# Patient Record
Sex: Male | Born: 1965 | Race: Black or African American | Hispanic: No | Marital: Married | Smoking: Never smoker
Health system: Southern US, Community
[De-identification: ages and names within clinical notes are randomized; demographics above are authoritative.]

## PROBLEM LIST (undated history)

## (undated) DIAGNOSIS — M549 Dorsalgia, unspecified: Secondary | ICD-10-CM

## (undated) DIAGNOSIS — I10 Essential (primary) hypertension: Secondary | ICD-10-CM

## (undated) DIAGNOSIS — K219 Gastro-esophageal reflux disease without esophagitis: Secondary | ICD-10-CM

## (undated) DIAGNOSIS — R42 Dizziness and giddiness: Secondary | ICD-10-CM

## (undated) DIAGNOSIS — W3400XA Accidental discharge from unspecified firearms or gun, initial encounter: Secondary | ICD-10-CM

## (undated) DIAGNOSIS — R0602 Shortness of breath: Secondary | ICD-10-CM

## (undated) DIAGNOSIS — S21339A Puncture wound without foreign body of unspecified front wall of thorax with penetration into thoracic cavity, initial encounter: Secondary | ICD-10-CM

## (undated) DIAGNOSIS — G4730 Sleep apnea, unspecified: Secondary | ICD-10-CM

## (undated) DIAGNOSIS — G8929 Other chronic pain: Secondary | ICD-10-CM

## (undated) DIAGNOSIS — R600 Localized edema: Secondary | ICD-10-CM

## (undated) HISTORY — PX: COLON SURGERY: SHX602

## (undated) HISTORY — PX: CHOLECYSTECTOMY: SHX55

## (undated) HISTORY — DX: Gastro-esophageal reflux disease without esophagitis: K21.9

## (undated) HISTORY — PX: FINGER SURGERY: SHX640

## (undated) HISTORY — PX: OTHER SURGICAL HISTORY: SHX169

---

## 2011-04-27 DIAGNOSIS — R42 Dizziness and giddiness: Secondary | ICD-10-CM

## 2011-04-27 DIAGNOSIS — R0602 Shortness of breath: Principal | ICD-10-CM

## 2011-04-27 HISTORY — DX: Dizziness and giddiness: R42

## 2011-04-27 HISTORY — DX: Shortness of breath: R06.02

## 2011-09-18 ENCOUNTER — Emergency Department (HOSPITAL_COMMUNITY): Payer: Medicaid - Out of State

## 2011-09-18 ENCOUNTER — Encounter (HOSPITAL_COMMUNITY): Payer: Self-pay | Admitting: Emergency Medicine

## 2011-09-18 ENCOUNTER — Emergency Department (HOSPITAL_COMMUNITY)
Admission: EM | Admit: 2011-09-18 | Discharge: 2011-09-18 | Disposition: A | Discharge status: Home / Self Care | Payer: Medicaid - Out of State | Attending: Emergency Medicine | Admitting: Emergency Medicine

## 2011-09-18 DIAGNOSIS — Z794 Long term (current) use of insulin: Secondary | ICD-10-CM | POA: Insufficient documentation

## 2011-09-18 DIAGNOSIS — I10 Essential (primary) hypertension: Secondary | ICD-10-CM | POA: Insufficient documentation

## 2011-09-18 DIAGNOSIS — R609 Edema, unspecified: Secondary | ICD-10-CM | POA: Insufficient documentation

## 2011-09-18 DIAGNOSIS — R0602 Shortness of breath: Secondary | ICD-10-CM | POA: Insufficient documentation

## 2011-09-18 DIAGNOSIS — J40 Bronchitis, not specified as acute or chronic: Secondary | ICD-10-CM | POA: Insufficient documentation

## 2011-09-18 DIAGNOSIS — E119 Type 2 diabetes mellitus without complications: Secondary | ICD-10-CM | POA: Insufficient documentation

## 2011-09-18 HISTORY — DX: Dorsalgia, unspecified: M54.9

## 2011-09-18 HISTORY — DX: Essential (primary) hypertension: I10

## 2011-09-18 HISTORY — DX: Other chronic pain: G89.29

## 2011-09-18 LAB — CBC
HCT: 39.1 % (ref 39.0–52.0)
Hemoglobin: 13 g/dL (ref 13.0–17.0)
MCH: 27.8 pg (ref 26.0–34.0)
MCHC: 33.2 g/dL (ref 30.0–36.0)
MCV: 83.5 fL (ref 78.0–100.0)
Platelets: 300 10*3/uL (ref 150–400)
RBC: 4.68 MIL/uL (ref 4.22–5.81)
RDW: 15.3 % (ref 11.5–15.5)
WBC: 12.6 10*3/uL — ABNORMAL HIGH (ref 4.0–10.5)

## 2011-09-18 LAB — BASIC METABOLIC PANEL
BUN: 12 mg/dL (ref 6–23)
CO2: 30 mEq/L (ref 19–32)
Calcium: 9.3 mg/dL (ref 8.4–10.5)
Chloride: 104 mEq/L (ref 96–112)
Creatinine, Ser: 0.89 mg/dL (ref 0.50–1.35)
GFR calc Af Amer: >90 mL/min (ref >90)
GFR calc non Af Amer: >90 mL/min (ref >90)
Glucose, Bld: 134 mg/dL — ABNORMAL HIGH (ref 70–99)
Potassium: 3.9 mEq/L (ref 3.5–5.1)
Sodium: 142 mEq/L (ref 135–145)

## 2011-09-18 LAB — URINALYSIS, ROUTINE W REFLEX MICROSCOPIC
Bilirubin Urine: NEGATIVE
Glucose, UA: >1000 mg/dL — AB
Hgb urine dipstick: NEGATIVE
Ketones, ur: NEGATIVE mg/dL
Leukocytes, UA: NEGATIVE
Nitrite: NEGATIVE
Protein, ur: 30 mg/dL — AB
Specific Gravity, Urine: 1.024 (ref 1.005–1.030)
Urobilinogen, UA: 0.2 mg/dL (ref 0.0–1.0)
pH: 6 (ref 5.0–8.0)

## 2011-09-18 LAB — DIFFERENTIAL
Basophils Absolute: 0 10*3/uL (ref 0.0–0.1)
Basophils Relative: 0 % (ref 0–1)
Eosinophils Absolute: 0.1 10*3/uL (ref 0.0–0.7)
Eosinophils Relative: 1 % (ref 0–5)
Lymphocytes Relative: 27 % (ref 12–46)
Lymphs Abs: 3.5 10*3/uL (ref 0.7–4.0)
Monocytes Absolute: 1 10*3/uL (ref 0.1–1.0)
Monocytes Relative: 8 % (ref 3–12)
Neutro Abs: 8.1 10*3/uL — ABNORMAL HIGH (ref 1.7–7.7)
Neutrophils Relative %: 64 % (ref 43–77)

## 2011-09-18 LAB — URINE MICROSCOPIC-ADD ON

## 2011-09-18 LAB — D-DIMER, QUANTITATIVE: D-Dimer, Quant: 0.41 ug/mL-FEU (ref 0.00–0.48)

## 2011-09-18 MED ORDER — AEROCHAMBER Z-STAT PLUS/MEDIUM MISC
1.0000 | Freq: Once | Status: AC
  Administered 2011-09-18: 1

## 2011-09-18 MED ORDER — ALBUTEROL SULFATE (5 MG/ML) 0.5% IN NEBU
INHALATION_SOLUTION | RESPIRATORY_TRACT | Status: AC
  Filled 2011-09-18: qty 0.5

## 2011-09-18 MED ORDER — ALBUTEROL SULFATE (5 MG/ML) 0.5% IN NEBU
5.0000 mg | INHALATION_SOLUTION | Freq: Once | RESPIRATORY_TRACT | Status: AC
  Administered 2011-09-18: 5 mg via RESPIRATORY_TRACT
  Filled 2011-09-18: qty 0.5

## 2011-09-18 MED ORDER — ALBUTEROL SULFATE HFA 108 (90 BASE) MCG/ACT IN AERS
2.0000 | INHALATION_SPRAY | RESPIRATORY_TRACT | Status: DC | PRN
  Administered 2011-09-18: 2 via RESPIRATORY_TRACT
  Filled 2011-09-18: qty 6.7

## 2011-09-18 MED ORDER — PREDNISONE 10 MG PO TABS: ORAL_TABLET | ORAL | Status: DC

## 2011-09-18 MED ORDER — PREDNISONE 20 MG PO TABS
60.0000 mg | ORAL_TABLET | Freq: Once | ORAL | Status: AC
  Administered 2011-09-18: 60 mg via ORAL
  Filled 2011-09-18: qty 3

## 2011-09-18 MED ORDER — IPRATROPIUM BROMIDE 0.02 % IN SOLN
0.5000 mg | Freq: Once | RESPIRATORY_TRACT | Status: AC
  Administered 2011-09-18: 0.5 mg via RESPIRATORY_TRACT
  Filled 2011-09-18: qty 2.5
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
## 2011-09-18 NOTE — Discharge Instructions (Signed)
Use the inhaler, 2 puffs every 4 hours as needed for cough, or trouble breathing. Use the resource guide to find a primary care Dr. to see for ongoing care.   Bronchitis Bronchitis is a problem of the air tubes leading to your lungs. This problem makes it hard for air to get in and out of the lungs. You may cough a lot because your air tubes are narrow. Going without care can cause lasting (chronic) bronchitis. HOME CARE   Drink enough fluids to keep your pee (urine) clear or pale yellow.   Use a cool mist humidifier.   Quit smoking if you smoke. If you keep smoking, the bronchitis might not get better.   Only take medicine as told by your doctor.  GET HELP RIGHT AWAY IF:   Coughing keeps you awake.   You start to wheeze.   You become more sick or weak.   You have a hard time breathing or get short of breath.   You cough up blood.   Coughing lasts more than 2 weeks.   You have a fever.   Your baby is older than 3 months with a rectal temperature of 102 F (38.9 C) or higher.   Your baby is 61 months old or younger with a rectal temperature of 100.4 F (38 C) or higher.  MAKE SURE YOU:  Understand these instructions.   Will watch your condition.   Will get help right away if you are not doing well or get worse.  Document Released: 08/29/2007 Document Revised: 03/01/2011 Document Reviewed: 02/11/2009 [facility] Patient Information 2012 [place], [place].  RESOURCE GUIDE  Chronic Pain Problems: Contact [facility]  [phone] Patients need to be referred by their primary care doctor.  Insufficient Money for Medicine: Contact United Way:  call "211" or [facility] [phone].  No Primary Care Doctor: - Call [facility]  [phone] - can help you locate a primary care doctor that  accepts your insurance, provides certain services, etc. - Physician Referral [phone]  Agencies that provide inexpensive medical care: - [facility]  [phone] - [facility]  [phone] - [facility]  [phone] [facility]  [phone] - [facility]  [phone] [facility]  [phone]  Medicaid-accepting [facility] Providers: - [facility]- [address]  [phone], Mon-Fri 9am-7pm, Sat 9am-1pm - [facility]- [address] [place], [address] [place]  [phone] - [facility]- [address] [place]  [phone] [facility]- [address]  [phone] - Renaye Rakers- [address] [place], [address], [phone]  Only accepts [place] patients after they have their name  applied to their card  Self Pay (no insurance) in [place]: - Sickle Cell Patients: Dr Willey Blade, [facility]  [address] [place], [phone] - [facility]- [address] [place]  [phone]       [facility] [place]- [address]       -     [facility]- see information above (Speak to [employer] if you do not have insurance)       -  [facility]- [address] [place], [phone]       -  [facility] [place]- [address]  [phone]       -  [facility]- [address] [phone]       -  Dr Julio Sicks-  [address], [address], [place], [phone]       -  [facility]- [address] [phone]       -  [facility]- [address], [phone], also [address] [phone]       -    [facility]- [address] [place], [phone], 1st & 3rd Saturday   every month, 10am-1pm  1) Find a Doctor and Pay Out of Pocket Although you won't have to find out who is covered by your insurance plan, it is a good idea to ask around and get recommendations. You will then need to call the office and see if the doctor you have chosen will accept you as a new patient and what  types of options they offer for patients who are self-pay. Some doctors offer discounts or will set up payment plans for their patients who do not have insurance, but you will need to ask so you aren't surprised when you get to your appointment.  2) Contact Your Local Health Department Not all health departments have doctors that can see patients for sick visits, but many do, so it is worth a call to see if yours does. If you don't know where your local health department is, you can check in your phone book. The CDC also has a tool to help you locate your state's health department, and many state websites also have listings of all of their local health departments.  3) Find a Walk-in Clinic If your illness is not likely to be very severe or complicated, you may want to try a walk in clinic. These are popping up all over the country in pharmacies, drugstores, and shopping centers. They're usually staffed by nurse practitioners or physician assistants that have been trained to treat common illnesses and complaints. They're usually fairly quick and inexpensive. However, if you have serious medical issues or chronic medical problems, these are probably not your best option  STD Testing - [facility] Department of [facility], STD Clinic, [address] [place], phone [phone] or [phone].  Monday - Friday, call for an appointment. [facility] Department of [employer], STD Clinic, [address], [place], phone [phone] or [phone].  Monday - Friday, call for an appointment.  Abuse/Neglect: [facility] Child Abuse Hotline 709-705-2973 [facility] Child Abuse Hotline (902)617-4841 (After Hours)  Emergency Shelter:  [facility] [phone]  Maternity Homes: - [facility] [place] [facility] [phone] - [facility] (531)[phone]  MRSA Hotline #:   914-106-3852  [facility]  Resources  [facility]  United Way [facility] Dept. [address]                 [address]         [address] [place] [address]  [address]  [facility] Phone:  [phone]                                  Phone:  [phone]                   Phone:  [phone]  [facility], [phone] - [phone]       -     [facility] in [place], [address],                                  [phone], [facility] Child Abuse Hotline 936-391-9114 or (315)765-8458 (After Hours)   Behavioral Health Services  Substance Abuse Resources: - Alcohol and Drug Services  845-368-2800 - [facility] ([phone] - [facility] [phone] Floydene Flock [phone] - Residential & Outpatient Substance Abuse Program  ([phone]  Psychological Services: [facility]  [phone] [facility]  [phone] - [facility], [phone] [place]. [address] [place], ACCESS LINE: [phone] or [phone], [email or web address]  Dental Assistance  If unable to pay or uninsured, contact:  [facility] or [facility]. to become qualified for the adult dental clinic.  Patients with Medicaid: [facility] [phone] [address], [phone] [address], [phone]  If unable to pay, or uninsured, contact [facility] [phone]) or [facility] Department [phone] in [place], [phone] in [facility]) to become qualified for the adult dental clinic  Other Low-Cost Community Dental Services: - [facility]- [address] [place], [place], [place], [address], [phone], Ext. [phone], 2nd and 4th Thursday of the month at 6:30am.  10 clients each day by  appointment, can sometimes see walk-in patients if someone does not show for an appointment. [facility]- [address] [place], [place], [address], [phone] - [facility]- [address], [place], [place], [address], [phone] - [facility]- [phone] [facility] Health Department- [phone] [facility] Department- [phone]
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
## 2011-09-18 NOTE — ED Notes (Signed)
Pt. Stated, i was helping my wife move something upstairs and i got so SOB, and i feel like my speech was slurred.
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
## 2011-09-19 LAB — OCCULT BLOOD, POC DEVICE: Fecal Occult Bld: NEGATIVE

## 2011-09-26 ENCOUNTER — Encounter (HOSPITAL_COMMUNITY): Payer: Self-pay | Admitting: *Deleted

## 2011-09-26 ENCOUNTER — Emergency Department (HOSPITAL_COMMUNITY)
Admission: EM | Admit: 2011-09-26 | Discharge: 2011-09-26 | Disposition: A | Discharge status: Home / Self Care | Payer: Medicaid - Out of State | Attending: Emergency Medicine | Admitting: Emergency Medicine

## 2011-09-26 DIAGNOSIS — G8929 Other chronic pain: Secondary | ICD-10-CM | POA: Insufficient documentation

## 2011-09-26 DIAGNOSIS — J40 Bronchitis, not specified as acute or chronic: Secondary | ICD-10-CM | POA: Insufficient documentation

## 2011-09-26 DIAGNOSIS — E8809 Other disorders of plasma-protein metabolism, not elsewhere classified: Secondary | ICD-10-CM | POA: Insufficient documentation

## 2011-09-26 DIAGNOSIS — E119 Type 2 diabetes mellitus without complications: Secondary | ICD-10-CM | POA: Insufficient documentation

## 2011-09-26 DIAGNOSIS — M549 Dorsalgia, unspecified: Secondary | ICD-10-CM | POA: Insufficient documentation

## 2011-09-26 DIAGNOSIS — I10 Essential (primary) hypertension: Secondary | ICD-10-CM | POA: Insufficient documentation

## 2011-09-26 LAB — POCT I-STAT, CHEM 8
BUN: 16 mg/dL (ref 6–23)
Calcium, Ion: 1.05 mmol/L — ABNORMAL LOW (ref 1.12–1.32)
Chloride: 103 mEq/L (ref 96–112)
Creatinine, Ser: 1.1 mg/dL (ref 0.50–1.35)
Glucose, Bld: 227 mg/dL — ABNORMAL HIGH (ref 70–99)
HCT: 39 % (ref 39.0–52.0)
Hemoglobin: 13.3 g/dL (ref 13.0–17.0)
Potassium: 3.6 mEq/L (ref 3.5–5.1)
Sodium: 140 mEq/L (ref 135–145)
TCO2: 25 mmol/L (ref 0–100)

## 2011-09-26 LAB — HEPATIC FUNCTION PANEL
ALT: 35 U/L (ref 0–53)
AST: 16 U/L (ref 0–37)
Albumin: 2.8 g/dL — ABNORMAL LOW (ref 3.5–5.2)
Alkaline Phosphatase: 87 U/L (ref 39–117)
Bilirubin, Direct: <0.1 mg/dL (ref 0.0–0.3)
Total Bilirubin: 0.2 mg/dL — ABNORMAL LOW (ref 0.3–1.2)
Total Protein: 6 g/dL (ref 6.0–8.3)

## 2011-09-26 LAB — PRO B NATRIURETIC PEPTIDE: Pro B Natriuretic peptide (BNP): 149.6 pg/mL — ABNORMAL HIGH (ref 0–125)

## 2011-09-26 MED ORDER — IPRATROPIUM BROMIDE 0.02 % IN SOLN
0.5000 mg | Freq: Once | RESPIRATORY_TRACT | Status: AC
  Administered 2011-09-26: 0.5 mg via RESPIRATORY_TRACT
  Filled 2011-09-26: qty 2.5

## 2011-09-26 MED ORDER — ALBUTEROL SULFATE (5 MG/ML) 0.5% IN NEBU
2.5000 mg | INHALATION_SOLUTION | Freq: Once | RESPIRATORY_TRACT | Status: AC
  Administered 2011-09-26: 2.5 mg via RESPIRATORY_TRACT
  Filled 2011-09-26: qty 0.5
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
## 2011-09-26 NOTE — ED Notes (Signed)
The pt says he has been sob for several days and has been seen here for the same 2-3 days ago.  He was diagnosed with bronchitis

## 2011-09-26 NOTE — ED Notes (Signed)
Patient reports he is feeling much better after the breathing treatment.  Patient states he cannot walk up the steps w/o having shortness of breath.  Patient with noted pitting edema to bil lower extremities.  Patient states this is a new sx for him.  ermd aware.  Lungs are clear at this time.  He denies having any chest pain

## 2011-11-10 ENCOUNTER — Emergency Department (HOSPITAL_COMMUNITY)
Admission: EM | Admit: 2011-11-10 | Discharge: 2011-11-10 | Disposition: A | Discharge status: Home / Self Care | Payer: Medicaid - Out of State | Attending: Emergency Medicine | Admitting: Emergency Medicine

## 2011-11-10 ENCOUNTER — Encounter (HOSPITAL_COMMUNITY): Payer: Self-pay | Admitting: *Deleted

## 2011-11-10 DIAGNOSIS — I10 Essential (primary) hypertension: Secondary | ICD-10-CM | POA: Insufficient documentation

## 2011-11-10 DIAGNOSIS — Z794 Long term (current) use of insulin: Secondary | ICD-10-CM | POA: Insufficient documentation

## 2011-11-10 DIAGNOSIS — E119 Type 2 diabetes mellitus without complications: Secondary | ICD-10-CM | POA: Insufficient documentation

## 2011-11-10 DIAGNOSIS — L089 Local infection of the skin and subcutaneous tissue, unspecified: Secondary | ICD-10-CM

## 2011-11-10 DIAGNOSIS — L0889 Other specified local infections of the skin and subcutaneous tissue: Secondary | ICD-10-CM | POA: Insufficient documentation

## 2011-11-10 MED ORDER — DOXYCYCLINE HYCLATE 100 MG PO CAPS: 100.0000 mg | ORAL_CAPSULE | Freq: Two times a day (BID) | ORAL | Status: AC

## 2011-11-10 NOTE — ED Provider Notes (Signed)
History  This chart was scribed for Benny Lennert, MD by Ladona Ridgel Day. This patient was seen in room TR06C/TR06C and the patient's care was started at 1838.   CSN: [id number]  Arrival date & time 11/10/11  1838   None     Chief Complaint  Patient presents with  . Abscess   Patient is a 46 y.o. male presenting with abscess. The history is provided by the patient. No language interpreter was used.  Abscess  This is a new problem. The current episode started less than one week ago. The onset was gradual. The problem occurs continuously. The problem has been gradually worsening. The abscess is present on the abdomen. The problem is moderate. The abscess is characterized by painfulness and redness. Pertinent negatives include no diarrhea, no congestion and no cough.   Gary Franco is a 46 y.o. male who presents to the Emergency Department complaining of a skin ulcer to his left lower abdomen which began about one week ago. He states that it is painful and that he has not tried taking any medication for it yet at home. His TD vaccine is UTD. He denies any other injuries/illnesses at this time.  Past Medical History  Diagnosis Date  . Diabetes mellitus   . Hypertension   . Back pain, chronic     History reviewed. No pertinent past surgical history.  History reviewed. No pertinent family history.  History  Substance Use Topics  . Smoking status: Not on file  . Smokeless tobacco: Not on file  . Alcohol Use: No      Review of Systems  Constitutional: Negative for fatigue.  HENT: Negative for congestion, sinus pressure and ear discharge.   Eyes: Negative for discharge.  Respiratory: Negative for cough.   Cardiovascular: Negative for chest pain.  Gastrointestinal: Negative for abdominal pain and diarrhea.  Genitourinary: Negative for frequency and hematuria.  Musculoskeletal: Negative for back pain.  Skin: Negative for rash.  Neurological: Negative for seizures and headaches.    Hematological: Negative.   Psychiatric/Behavioral: Negative for hallucinations.  All other systems reviewed and are negative.    Allergies  Review of patient's allergies indicates no known allergies.  Home Medications   Current Outpatient Rx  Name Route Sig Dispense Refill  . ATORVASTATIN CALCIUM 40 MG PO TABS Oral Take 40 mg by mouth daily.    Marland Kitchen DOXYCYCLINE HYCLATE 100 MG PO CAPS Oral Take 1 capsule (100 mg total) by mouth 2 (two) times daily. 20 capsule 0  . ESOMEPRAZOLE MAGNESIUM 40 MG PO CPDR Oral Take 40 mg by mouth daily before breakfast.    . INSULIN GLARGINE 100 UNIT/ML [place] SOLN Subcutaneous Inject 60 Units into the skin 3 (three) times daily.    . INSULIN LISPRO (HUMAN) 100 UNIT/ML [place] SOLN Subcutaneous Inject 22 Units into the skin 3 (three) times daily before meals.    Marland Kitchen LISINOPRIL 40 MG PO TABS Oral Take 40 mg by mouth daily.    Marland Kitchen METOPROLOL SUCCINATE ER 200 MG PO TB24 Oral Take 200 mg by mouth daily.    Marland Kitchen NAPROXEN SODIUM 220 MG PO TABS Oral Take 220 mg by mouth 3 (three) times daily as needed. For pain    . PREDNISONE 10 MG PO TABS Oral Take 10-60 mg by mouth daily. pred pak tapering dose. 10 mg started 09/18/11    . QUINAPRIL-HYDROCHLOROTHIAZIDE 10-12.5 MG PO TABS Oral Take 1 tablet by mouth daily.      Triage Vitals: BP 171/69  Pulse  64  Temp 98.1 F (36.7 C) (Oral)  Resp 22  SpO2 99%  Physical Exam  Nursing note and vitals reviewed. Constitutional: He is oriented to person, place, and time. He appears well-developed.  HENT:  Head: Normocephalic.  Eyes: Conjunctivae are normal.  Neck: No tracheal deviation present.  Cardiovascular:  No murmur heard. Musculoskeletal: Normal range of motion.  Neurological: He is oriented to person, place, and time.  Skin: Skin is warm.       Small ulcer 1 cm in diameter with inflammation around it.   Psychiatric: He has a normal mood and affect.    ED Course  Procedures (including critical care time) DIAGNOSTIC  STUDIES: Oxygen Saturation is 99% on room air, normal by my interpretation.    COORDINATION OF CARE: At 655 PM Discussed treatment plan with patient which includes cleaning/dressing his wound here in the ED and advised him to clean it twice per day with peroxide. Patient agrees.   Labs Reviewed - No data to display No results found.   1. Skin infection       MDM  The chart was scribed for me under my direct supervision.  I personally performed the history, physical, and medical decision making and all procedures in the evaluation of this patient.Benny Lennert, MD 11/10/11 [id number]

## 2011-11-10 NOTE — ED Notes (Signed)
Pt reports bump to lower abd, has become more painful and has drainage, pt thinks it is a spider bite.

## 2011-11-26 ENCOUNTER — Emergency Department (HOSPITAL_COMMUNITY): Payer: Medicaid - Out of State

## 2011-11-26 ENCOUNTER — Encounter (HOSPITAL_COMMUNITY): Payer: Self-pay | Admitting: Family Medicine

## 2011-11-26 ENCOUNTER — Emergency Department (HOSPITAL_COMMUNITY)
Admission: EM | Admit: 2011-11-26 | Discharge: 2011-11-26 | Disposition: A | Discharge status: Home / Self Care | Payer: Medicaid - Out of State | Attending: Emergency Medicine | Admitting: Emergency Medicine

## 2011-11-26 DIAGNOSIS — E119 Type 2 diabetes mellitus without complications: Secondary | ICD-10-CM | POA: Insufficient documentation

## 2011-11-26 DIAGNOSIS — Z794 Long term (current) use of insulin: Secondary | ICD-10-CM | POA: Insufficient documentation

## 2011-11-26 DIAGNOSIS — M545 Low back pain, unspecified: Secondary | ICD-10-CM | POA: Insufficient documentation

## 2011-11-26 DIAGNOSIS — M549 Dorsalgia, unspecified: Secondary | ICD-10-CM

## 2011-11-26 DIAGNOSIS — I10 Essential (primary) hypertension: Secondary | ICD-10-CM | POA: Insufficient documentation

## 2011-11-26 DIAGNOSIS — R110 Nausea: Secondary | ICD-10-CM | POA: Insufficient documentation

## 2011-11-26 DIAGNOSIS — R109 Unspecified abdominal pain: Secondary | ICD-10-CM | POA: Insufficient documentation

## 2011-11-26 DIAGNOSIS — R0602 Shortness of breath: Secondary | ICD-10-CM | POA: Insufficient documentation

## 2011-11-26 DIAGNOSIS — Z79899 Other long term (current) drug therapy: Secondary | ICD-10-CM | POA: Insufficient documentation

## 2011-11-26 LAB — URINALYSIS, ROUTINE W REFLEX MICROSCOPIC
Bilirubin Urine: NEGATIVE
Glucose, UA: NEGATIVE mg/dL
Hgb urine dipstick: NEGATIVE
Ketones, ur: NEGATIVE mg/dL
Leukocytes, UA: NEGATIVE
Nitrite: NEGATIVE
Protein, ur: 30 mg/dL — AB
Specific Gravity, Urine: 1.028 (ref 1.005–1.030)
Urobilinogen, UA: 1 mg/dL (ref 0.0–1.0)
pH: 5.5 (ref 5.0–8.0)

## 2011-11-26 LAB — POCT I-STAT, CHEM 8
BUN: 23 mg/dL (ref 6–23)
Calcium, Ion: 1.05 mmol/L — ABNORMAL LOW (ref 1.12–1.23)
Chloride: 104 mEq/L (ref 96–112)
Creatinine, Ser: 1 mg/dL (ref 0.50–1.35)
Glucose, Bld: 232 mg/dL — ABNORMAL HIGH (ref 70–99)
HCT: 44 % (ref 39.0–52.0)
Hemoglobin: 15 g/dL (ref 13.0–17.0)
Potassium: 6.1 mEq/L — ABNORMAL HIGH (ref 3.5–5.1)
Sodium: 139 mEq/L (ref 135–145)
TCO2: 27 mmol/L (ref 0–100)

## 2011-11-26 LAB — URINE MICROSCOPIC-ADD ON

## 2011-11-26 LAB — POTASSIUM: Potassium: 3.9 mEq/L (ref 3.5–5.1)

## 2011-11-26 MED ORDER — CYCLOBENZAPRINE HCL 10 MG PO TABS: 10.0000 mg | ORAL_TABLET | Freq: Three times a day (TID) | ORAL | Status: AC | PRN

## 2011-11-26 MED ORDER — SODIUM CHLORIDE 0.9 % IV BOLUS (SEPSIS)
1000.0000 mL | Freq: Once | INTRAVENOUS | Status: AC
  Administered 2011-11-26: 1000 mL via INTRAVENOUS

## 2011-11-26 MED ORDER — HYDROCODONE-ACETAMINOPHEN 5-325 MG PO TABS: 1.0000 | ORAL_TABLET | Freq: Four times a day (QID) | ORAL | Status: AC | PRN

## 2011-11-26 MED ORDER — MORPHINE SULFATE 4 MG/ML IJ SOLN
6.0000 mg | Freq: Once | INTRAMUSCULAR | Status: AC
  Administered 2011-11-26: 6 mg via INTRAVENOUS
  Filled 2011-11-26 (×2): qty 1

## 2011-11-26 NOTE — ED Notes (Signed)
Patient transported to CT 
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
## 2011-11-26 NOTE — ED Notes (Signed)
Pt sts right flank pain that started this am. sts nausea. Denies urinary symptoms. sts also SOB

## 2011-11-26 NOTE — ED Provider Notes (Signed)
Medical screening examination/treatment/procedure(s) were performed by non-physician practitioner and as supervising physician I was immediately available for consultation/collaboration.  Jones Skene, M.D.     Jones Skene, MD 11/26/11 2122

## 2012-02-14 ENCOUNTER — Encounter (HOSPITAL_COMMUNITY): Payer: Self-pay | Admitting: *Deleted

## 2012-02-14 ENCOUNTER — Emergency Department (HOSPITAL_COMMUNITY): Payer: BC Managed Care – PPO

## 2012-02-14 ENCOUNTER — Emergency Department (HOSPITAL_COMMUNITY)
Admission: EM | Admit: 2012-02-14 | Discharge: 2012-02-14 | Disposition: A | Discharge status: Home / Self Care | Payer: BC Managed Care – PPO | Attending: Emergency Medicine | Admitting: Emergency Medicine

## 2012-02-14 DIAGNOSIS — M549 Dorsalgia, unspecified: Secondary | ICD-10-CM | POA: Insufficient documentation

## 2012-02-14 DIAGNOSIS — E119 Type 2 diabetes mellitus without complications: Secondary | ICD-10-CM | POA: Insufficient documentation

## 2012-02-14 DIAGNOSIS — Z794 Long term (current) use of insulin: Secondary | ICD-10-CM | POA: Insufficient documentation

## 2012-02-14 DIAGNOSIS — G8929 Other chronic pain: Secondary | ICD-10-CM | POA: Insufficient documentation

## 2012-02-14 DIAGNOSIS — R079 Chest pain, unspecified: Secondary | ICD-10-CM

## 2012-02-14 DIAGNOSIS — Z79899 Other long term (current) drug therapy: Secondary | ICD-10-CM | POA: Insufficient documentation

## 2012-02-14 DIAGNOSIS — I10 Essential (primary) hypertension: Secondary | ICD-10-CM | POA: Insufficient documentation

## 2012-02-14 LAB — BASIC METABOLIC PANEL
BUN: 14 mg/dL (ref 6–23)
CO2: 29 mEq/L (ref 19–32)
Calcium: 9.2 mg/dL (ref 8.4–10.5)
Chloride: 94 mEq/L — ABNORMAL LOW (ref 96–112)
Creatinine, Ser: 0.88 mg/dL (ref 0.50–1.35)
GFR calc Af Amer: >90 mL/min (ref >90)
GFR calc non Af Amer: >90 mL/min (ref >90)
Glucose, Bld: 284 mg/dL — ABNORMAL HIGH (ref 70–99)
Potassium: 4 mEq/L (ref 3.5–5.1)
Sodium: 131 mEq/L — ABNORMAL LOW (ref 135–145)

## 2012-02-14 LAB — POCT I-STAT TROPONIN I: Troponin i, poc: 0 ng/mL (ref 0.00–0.08)

## 2012-02-14 LAB — CBC
HCT: 42.3 % (ref 39.0–52.0)
Hemoglobin: 14.2 g/dL (ref 13.0–17.0)
MCH: 28.2 pg (ref 26.0–34.0)
MCHC: 33.6 g/dL (ref 30.0–36.0)
MCV: 84.1 fL (ref 78.0–100.0)
Platelets: 316 10*3/uL (ref 150–400)
RBC: 5.03 MIL/uL (ref 4.22–5.81)
RDW: 15 % (ref 11.5–15.5)
WBC: 12.5 10*3/uL — ABNORMAL HIGH (ref 4.0–10.5)

## 2012-02-14 MED ORDER — TRAMADOL HCL 50 MG PO TABS: 50.0000 mg | ORAL_TABLET | Freq: Four times a day (QID) | ORAL | Status: DC | PRN

## 2012-02-14 MED ORDER — ONDANSETRON HCL 4 MG/2ML IJ SOLN
4.0000 mg | Freq: Once | INTRAMUSCULAR | Status: AC
  Administered 2012-02-14: 4 mg via INTRAVENOUS
  Filled 2012-02-14: qty 2

## 2012-02-14 MED ORDER — MORPHINE SULFATE 4 MG/ML IJ SOLN
6.0000 mg | Freq: Once | INTRAMUSCULAR | Status: AC
  Administered 2012-02-14: 6 mg via INTRAVENOUS
  Filled 2012-02-14: qty 2

## 2012-02-14 MED ORDER — ASPIRIN 81 MG PO CHEW
324.0000 mg | CHEWABLE_TABLET | Freq: Once | ORAL | Status: AC
  Administered 2012-02-14: 324 mg via ORAL
  Filled 2012-02-14: qty 4

## 2012-02-14 NOTE — ED Provider Notes (Signed)
Medical screening examination/treatment/procedure(s) were performed by non-physician practitioner and as supervising physician I was immediately available for consultation/collaboration.   Lyanne Co, MD 02/14/12 2236
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
## 2012-02-14 NOTE — ED Notes (Signed)
Pt given d/c orders and prescriptions. Pt has no further questions upon d/c. Pt does not appear to be in any acute distress upon d/c.
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
## 2012-02-14 NOTE — ED Notes (Signed)
Patient c/o right sided chest pain x 3 days, denies n/v, sob.  Patient states hurts when he moves a certain way

## 2012-07-17 ENCOUNTER — Observation Stay (HOSPITAL_COMMUNITY): Payer: Medicaid - Out of State

## 2012-07-17 ENCOUNTER — Emergency Department (HOSPITAL_COMMUNITY): Payer: Medicaid - Out of State

## 2012-07-17 ENCOUNTER — Observation Stay (HOSPITAL_COMMUNITY)
Admission: EM | Admit: 2012-07-17 | Discharge: 2012-07-17 | Discharge status: Against Medical Advice | Payer: Medicaid - Out of State | Attending: Internal Medicine | Admitting: Internal Medicine

## 2012-07-17 ENCOUNTER — Encounter (HOSPITAL_COMMUNITY): Payer: Self-pay | Admitting: Emergency Medicine

## 2012-07-17 DIAGNOSIS — R600 Localized edema: Secondary | ICD-10-CM | POA: Diagnosis present

## 2012-07-17 DIAGNOSIS — R079 Chest pain, unspecified: Secondary | ICD-10-CM | POA: Insufficient documentation

## 2012-07-17 DIAGNOSIS — Z1889 Other specified retained foreign body fragments: Secondary | ICD-10-CM | POA: Insufficient documentation

## 2012-07-17 DIAGNOSIS — E785 Hyperlipidemia, unspecified: Secondary | ICD-10-CM | POA: Diagnosis present

## 2012-07-17 DIAGNOSIS — M7989 Other specified soft tissue disorders: Secondary | ICD-10-CM | POA: Insufficient documentation

## 2012-07-17 DIAGNOSIS — I6529 Occlusion and stenosis of unspecified carotid artery: Secondary | ICD-10-CM | POA: Insufficient documentation

## 2012-07-17 DIAGNOSIS — I517 Cardiomegaly: Secondary | ICD-10-CM | POA: Insufficient documentation

## 2012-07-17 DIAGNOSIS — E119 Type 2 diabetes mellitus without complications: Secondary | ICD-10-CM | POA: Diagnosis present

## 2012-07-17 DIAGNOSIS — I700 Atherosclerosis of aorta: Secondary | ICD-10-CM | POA: Insufficient documentation

## 2012-07-17 DIAGNOSIS — R0602 Shortness of breath: Principal | ICD-10-CM | POA: Diagnosis present

## 2012-07-17 DIAGNOSIS — I10 Essential (primary) hypertension: Secondary | ICD-10-CM | POA: Diagnosis present

## 2012-07-17 DIAGNOSIS — M549 Dorsalgia, unspecified: Secondary | ICD-10-CM | POA: Insufficient documentation

## 2012-07-17 HISTORY — DX: Puncture wound without foreign body of unspecified front wall of thorax with penetration into thoracic cavity, initial encounter: S21.339A

## 2012-07-17 HISTORY — DX: Accidental discharge from unspecified firearms or gun, initial encounter: W34.00XA

## 2012-07-17 HISTORY — DX: Shortness of breath: R06.02

## 2012-07-17 HISTORY — DX: Localized edema: R60.0

## 2012-07-17 HISTORY — DX: Sleep apnea, unspecified: G47.30

## 2012-07-17 HISTORY — DX: Dizziness and giddiness: R42

## 2012-07-17 LAB — CBC WITH DIFFERENTIAL/PLATELET
Basophils Absolute: 0 10*3/uL (ref 0.0–0.1)
Basophils Relative: 0 % (ref 0–1)
Eosinophils Absolute: 0.2 10*3/uL (ref 0.0–0.7)
Eosinophils Relative: 2 % (ref 0–5)
HCT: 38.2 % — ABNORMAL LOW (ref 39.0–52.0)
Hemoglobin: 12.7 g/dL — ABNORMAL LOW (ref 13.0–17.0)
Lymphocytes Relative: 33 % (ref 12–46)
Lymphs Abs: 3.3 10*3/uL (ref 0.7–4.0)
MCH: 27.3 pg (ref 26.0–34.0)
MCHC: 33.2 g/dL (ref 30.0–36.0)
MCV: 82.2 fL (ref 78.0–100.0)
Monocytes Absolute: 0.8 10*3/uL (ref 0.1–1.0)
Monocytes Relative: 8 % (ref 3–12)
Neutro Abs: 5.8 10*3/uL (ref 1.7–7.7)
Neutrophils Relative %: 57 % (ref 43–77)
Platelets: 313 10*3/uL (ref 150–400)
RBC: 4.65 MIL/uL (ref 4.22–5.81)
RDW: 15.2 % (ref 11.5–15.5)
WBC: 10.1 10*3/uL (ref 4.0–10.5)

## 2012-07-17 LAB — BASIC METABOLIC PANEL
BUN: 15 mg/dL (ref 6–23)
CO2: 31 mEq/L (ref 19–32)
Calcium: 8.7 mg/dL (ref 8.4–10.5)
Chloride: 99 mEq/L (ref 96–112)
Creatinine, Ser: 0.97 mg/dL (ref 0.50–1.35)
GFR calc Af Amer: >90 mL/min (ref >90)
GFR calc non Af Amer: >90 mL/min (ref >90)
Glucose, Bld: 337 mg/dL — ABNORMAL HIGH (ref 70–99)
Potassium: 4 mEq/L (ref 3.5–5.1)
Sodium: 137 mEq/L (ref 135–145)

## 2012-07-17 LAB — HEPATIC FUNCTION PANEL
ALT: 12 U/L (ref 0–53)
AST: 13 U/L (ref 0–37)
Albumin: 3.1 g/dL — ABNORMAL LOW (ref 3.5–5.2)
Alkaline Phosphatase: 93 U/L (ref 39–117)
Bilirubin, Direct: <0.1 mg/dL (ref 0.0–0.3)
Total Bilirubin: 0.2 mg/dL — ABNORMAL LOW (ref 0.3–1.2)
Total Protein: 6.9 g/dL (ref 6.0–8.3)

## 2012-07-17 LAB — PROTIME-INR
INR: 0.98 (ref 0.00–1.49)
Prothrombin Time: 12.9 seconds (ref 11.6–15.2)

## 2012-07-17 LAB — HEMOGLOBIN A1C
Hgb A1c MFr Bld: 11 % — ABNORMAL HIGH (ref <5.7)
Mean Plasma Glucose: 269 mg/dL — ABNORMAL HIGH (ref <117)

## 2012-07-17 LAB — GLUCOSE, CAPILLARY: Glucose-Capillary: 324 mg/dL — ABNORMAL HIGH (ref 70–99)

## 2012-07-17 LAB — POCT I-STAT TROPONIN I: Troponin i, poc: 0.02 ng/mL (ref 0.00–0.08)

## 2012-07-17 LAB — APTT: aPTT: 28 seconds (ref 24–37)

## 2012-07-17 LAB — TROPONIN I
Troponin I: <0.3 ng/mL (ref <0.30)
Troponin I: <0.3 ng/mL (ref <0.30)

## 2012-07-17 LAB — PRO B NATRIURETIC PEPTIDE: Pro B Natriuretic peptide (BNP): 82.9 pg/mL (ref 0–125)

## 2012-07-17 MED ORDER — IOHEXOL 350 MG/ML SOLN
100.0000 mL | Freq: Once | INTRAVENOUS | Status: AC | PRN
  Administered 2012-07-17: 200 mL via INTRAVENOUS

## 2012-07-17 MED ORDER — HEPARIN BOLUS VIA INFUSION: 5000.0000 [IU] | Freq: Once | INTRAVENOUS | Status: DC

## 2012-07-17 MED ORDER — INSULIN GLARGINE 100 UNIT/ML ~~LOC~~ SOLN
65.0000 [IU] | Freq: Every day | SUBCUTANEOUS | Status: DC
Start: 2012-07-17 — End: 2012-07-17
  Filled 2012-07-17: qty 0.65

## 2012-07-17 MED ORDER — ATORVASTATIN CALCIUM 40 MG PO TABS
40.0000 mg | ORAL_TABLET | Freq: Every day | ORAL | Status: DC
  Administered 2012-07-17: 40 mg via ORAL
  Filled 2012-07-17: qty 1

## 2012-07-17 MED ORDER — INSULIN GLARGINE 100 UNIT/ML ~~LOC~~ SOLN: 60.0000 [IU] | Freq: Three times a day (TID) | SUBCUTANEOUS | Status: DC

## 2012-07-17 MED ORDER — HEPARIN (PORCINE) IN NACL 100-0.45 UNIT/ML-% IJ SOLN
2000.0000 [IU]/h | INTRAMUSCULAR | Status: DC
  Filled 2012-07-17 (×2): qty 250

## 2012-07-17 MED ORDER — SODIUM CHLORIDE 0.9 % IV SOLN
INTRAVENOUS | Status: AC
  Administered 2012-07-17: 15:00:00 via INTRAVENOUS

## 2012-07-17 MED ORDER — HEPARIN SODIUM (PORCINE) 5000 UNIT/ML IJ SOLN
5000.0000 [IU] | Freq: Three times a day (TID) | INTRAMUSCULAR | Status: DC
  Filled 2012-07-17 (×3): qty 1

## 2012-07-17 MED ORDER — PANTOPRAZOLE SODIUM 40 MG PO TBEC
80.0000 mg | DELAYED_RELEASE_TABLET | Freq: Every day | ORAL | Status: DC
  Administered 2012-07-17: 80 mg via ORAL
  Filled 2012-07-17 (×2): qty 2

## 2012-07-17 MED ORDER — SODIUM CHLORIDE 0.9 % IJ SOLN: 3.0000 mL | Freq: Two times a day (BID) | INTRAMUSCULAR | Status: DC

## 2012-07-17 MED ORDER — INSULIN ASPART 100 UNIT/ML ~~LOC~~ SOLN
0.0000 [IU] | Freq: Three times a day (TID) | SUBCUTANEOUS | Status: DC
  Administered 2012-07-17: 15 [IU] via SUBCUTANEOUS

## 2012-07-17 MED ORDER — ASPIRIN EC 81 MG PO TBEC
81.0000 mg | DELAYED_RELEASE_TABLET | Freq: Every day | ORAL | Status: DC
  Administered 2012-07-17: 81 mg via ORAL
  Filled 2012-07-17: qty 1

## 2012-07-17 MED ORDER — METOPROLOL SUCCINATE ER 100 MG PO TB24
200.0000 mg | ORAL_TABLET | Freq: Every day | ORAL | Status: DC
  Administered 2012-07-17: 200 mg via ORAL
  Filled 2012-07-17: qty 2

## 2012-07-17 MED ORDER — ASPIRIN 81 MG PO TBEC: 81.0000 mg | DELAYED_RELEASE_TABLET | Freq: Every day | ORAL | Status: DC
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
## 2012-07-17 NOTE — ED Notes (Signed)
Patient remains in CT 

## 2012-07-17 NOTE — ED Notes (Signed)
Per patient, he did not want to be admitted to the hospital.   He doesn't want heparin started, he doesn't want IV.  Patient stated CT okay.  I advised would have to have line for CT.   Spoke with admitting MD - she advised patient told her that he needed to be out of here by 2100 tonight.

## 2012-07-17 NOTE — ED Notes (Signed)
Patient states starting getting short of breath around 0330 today.   Patient denies any other symptoms.

## 2012-07-17 NOTE — ED Notes (Signed)
Patient advised that he does not want heparin drip until it is confirmed that he needs.  Patient states "no real reason to start heparin if I don't have PE".  MD aware and floor RN advised.
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
## 2012-07-17 NOTE — ED Provider Notes (Signed)
Medical screening examination/treatment/procedure(s) were performed by non-physician practitioner and as supervising physician I was immediately available for consultation/collaboration.  Cameo Schmiesing M Verta Riedlinger, MD 07/17/12 1756 
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
## 2012-07-17 NOTE — Progress Notes (Signed)
ANTICOAGULATION CONSULT NOTE - Initial Consult  Pharmacy Consult for Heparin Indication: R/O PE  No Known Allergies  Patient Measurements: Height: 6' (182.9 cm) Weight: 405 lb (183.707 kg) IBW/kg (Calculated) : 77.6 Heparin Dosing Weight: 124  Vital Signs: Temp: 98.2 F (36.8 C) (04/24 0442) BP: 144/61 mmHg (04/24 0930) Pulse Rate: 57 (04/24 0930)  Labs:  Recent Labs  07/17/12 0538  HGB 12.7*  HCT 38.2*  PLT 313  CREATININE 0.97    Estimated Creatinine Clearance: 158.1 ml/min (by C-G formula based on Cr of 0.97).   Medical History: Past Medical History  Diagnosis Date  . Diabetes mellitus   . Hypertension   . Back pain, chronic     Medications:  Scheduled:   Assessment: 47yo male with SOB, to start heparin for R/O PE.  Baseline Hg 12.7 and pltc wnl.  No PT, PTT has been drawn.  No heparin has been started.  Per d/w pt, he has no history of bleeding problems.  He is currently headed to CT  Goal of Therapy:  Heparin level 0.3-0.7 units/ml Monitor platelets by anticoagulation protocol: Yes   Plan:  1.  PTT, PT stat 2.  Heparin 5000units IV x 1, 2000 units/hr 3.  Check heparin level 6 hours 4.  Daily heparin level 5.  F/U CT scan results  Marisue Humble, PharmD Clinical Pharmacist [facility]

## 2012-07-21 NOTE — Discharge Summary (Signed)
INTERNAL MEDICINE TEACHING SERVICE Attending Note  Date: 07/21/2012  Patient name: Gary Franco  Medical record number: [id number]  Date of birth: Aug 09, 1965  This patient has been discussed with the house staff. Please see their note for complete details. I concur with their findings and plan.   The treatment plan was discussed in detail with the patient.  Alternatives to treatment, side effects, risks and benefits, and complications were discussed with the patient. Informed consent was obtained. The patient agrees to proceed with the current treatment plan.  Jonah Blue, DO  07/21/2012, 9:58 AM

## 2012-11-17 ENCOUNTER — Encounter (HOSPITAL_COMMUNITY): Payer: Self-pay

## 2012-11-17 ENCOUNTER — Emergency Department (HOSPITAL_COMMUNITY): Payer: Self-pay

## 2012-11-17 ENCOUNTER — Emergency Department (HOSPITAL_COMMUNITY)
Admission: EM | Admit: 2012-11-17 | Discharge: 2012-11-17 | Disposition: A | Discharge status: Home / Self Care | Payer: Self-pay | Attending: Emergency Medicine | Admitting: Emergency Medicine

## 2012-11-17 DIAGNOSIS — R062 Wheezing: Secondary | ICD-10-CM | POA: Insufficient documentation

## 2012-11-17 DIAGNOSIS — Z79899 Other long term (current) drug therapy: Secondary | ICD-10-CM | POA: Insufficient documentation

## 2012-11-17 DIAGNOSIS — Z7982 Long term (current) use of aspirin: Secondary | ICD-10-CM | POA: Insufficient documentation

## 2012-11-17 DIAGNOSIS — R0609 Other forms of dyspnea: Secondary | ICD-10-CM | POA: Insufficient documentation

## 2012-11-17 DIAGNOSIS — I10 Essential (primary) hypertension: Secondary | ICD-10-CM | POA: Insufficient documentation

## 2012-11-17 DIAGNOSIS — E669 Obesity, unspecified: Secondary | ICD-10-CM | POA: Insufficient documentation

## 2012-11-17 DIAGNOSIS — Z87828 Personal history of other (healed) physical injury and trauma: Secondary | ICD-10-CM | POA: Insufficient documentation

## 2012-11-17 DIAGNOSIS — G8929 Other chronic pain: Secondary | ICD-10-CM | POA: Insufficient documentation

## 2012-11-17 DIAGNOSIS — R0600 Dyspnea, unspecified: Secondary | ICD-10-CM

## 2012-11-17 DIAGNOSIS — R0989 Other specified symptoms and signs involving the circulatory and respiratory systems: Secondary | ICD-10-CM | POA: Insufficient documentation

## 2012-11-17 DIAGNOSIS — E119 Type 2 diabetes mellitus without complications: Secondary | ICD-10-CM | POA: Insufficient documentation

## 2012-11-17 DIAGNOSIS — Z794 Long term (current) use of insulin: Secondary | ICD-10-CM | POA: Insufficient documentation

## 2012-11-17 LAB — POCT I-STAT, CHEM 8
BUN: 14 mg/dL (ref 6–23)
Calcium, Ion: 1.07 mmol/L — ABNORMAL LOW (ref 1.12–1.23)
Chloride: 103 mEq/L (ref 96–112)
Creatinine, Ser: 1 mg/dL (ref 0.50–1.35)
Glucose, Bld: 150 mg/dL — ABNORMAL HIGH (ref 70–99)
HCT: 40 % (ref 39.0–52.0)
Hemoglobin: 13.6 g/dL (ref 13.0–17.0)
Potassium: 3.9 mEq/L (ref 3.5–5.1)
Sodium: 139 mEq/L (ref 135–145)
TCO2: 26 mmol/L (ref 0–100)

## 2012-11-17 LAB — CBC
HCT: 38.6 % — ABNORMAL LOW (ref 39.0–52.0)
Hemoglobin: 12.6 g/dL — ABNORMAL LOW (ref 13.0–17.0)
MCH: 27.8 pg (ref 26.0–34.0)
MCHC: 32.6 g/dL (ref 30.0–36.0)
MCV: 85 fL (ref 78.0–100.0)
Platelets: 333 10*3/uL (ref 150–400)
RBC: 4.54 MIL/uL (ref 4.22–5.81)
RDW: 15.1 % (ref 11.5–15.5)
WBC: 13.6 10*3/uL — ABNORMAL HIGH (ref 4.0–10.5)

## 2012-11-17 LAB — PRO B NATRIURETIC PEPTIDE: Pro B Natriuretic peptide (BNP): 160.5 pg/mL — ABNORMAL HIGH (ref 0–125)

## 2012-11-17 MED ORDER — IPRATROPIUM BROMIDE 0.02 % IN SOLN: 0.5000 mg | Freq: Once | RESPIRATORY_TRACT | Status: DC

## 2012-11-17 MED ORDER — ALBUTEROL SULFATE (5 MG/ML) 0.5% IN NEBU
2.5000 mg | INHALATION_SOLUTION | RESPIRATORY_TRACT | Status: DC
  Administered 2012-11-17: 2.5 mg via RESPIRATORY_TRACT
  Filled 2012-11-17: qty 0.5

## 2012-11-17 MED ORDER — IPRATROPIUM BROMIDE 0.02 % IN SOLN
0.5000 mg | RESPIRATORY_TRACT | Status: DC
  Administered 2012-11-17: 0.5 mg via RESPIRATORY_TRACT
  Filled 2012-11-17: qty 2.5

## 2012-11-17 MED ORDER — ALBUTEROL SULFATE (5 MG/ML) 0.5% IN NEBU: 5.0000 mg | INHALATION_SOLUTION | Freq: Once | RESPIRATORY_TRACT | Status: DC

## 2012-11-17 MED ORDER — ALBUTEROL SULFATE HFA 108 (90 BASE) MCG/ACT IN AERS
2.0000 | INHALATION_SPRAY | RESPIRATORY_TRACT | Status: DC | PRN
  Administered 2012-11-17: 2 via RESPIRATORY_TRACT
  Filled 2012-11-17: qty 6.7

## 2012-11-17 MED ORDER — AEROCHAMBER PLUS W/MASK MISC
1.0000 | Freq: Once | Status: AC
  Administered 2012-11-17: 1
  Filled 2012-11-17: qty 1

## 2012-11-17 NOTE — ED Notes (Signed)
Pt ambulated around ED with slow, steady gait. O2 saturation maintained between 86%-90% on RA during ambulation with no complaints of SOB. RR-18. HR-92.

## 2012-11-17 NOTE — ED Notes (Signed)
Pts O2 sats dropped to 90% on RA. Placed on 2L South  and sats increased to 97%.
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
## 2012-11-17 NOTE — ED Notes (Signed)
Pt reports SOB with exp wheezing. Pt states she sleeps with a CPAP due to sleep apnea but has not been cleaning it as often as he should have. Reports productive cough of white phlegm. Denies CP.

## 2013-06-18 ENCOUNTER — Ambulatory Visit: Payer: Medicaid Other | Attending: Anesthesiology | Admitting: Physical Therapy
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
## 2013-07-01 ENCOUNTER — Emergency Department (HOSPITAL_COMMUNITY): Payer: Medicaid Other

## 2013-07-01 ENCOUNTER — Encounter (HOSPITAL_COMMUNITY): Payer: Self-pay | Admitting: Emergency Medicine

## 2013-07-01 ENCOUNTER — Emergency Department (HOSPITAL_COMMUNITY)
Admission: EM | Admit: 2013-07-01 | Discharge: 2013-07-01 | Disposition: A | Discharge status: Home / Self Care | Payer: Medicaid Other | Attending: Emergency Medicine | Admitting: Emergency Medicine

## 2013-07-01 DIAGNOSIS — Z794 Long term (current) use of insulin: Secondary | ICD-10-CM | POA: Insufficient documentation

## 2013-07-01 DIAGNOSIS — M7989 Other specified soft tissue disorders: Secondary | ICD-10-CM

## 2013-07-01 DIAGNOSIS — R0602 Shortness of breath: Secondary | ICD-10-CM | POA: Insufficient documentation

## 2013-07-01 DIAGNOSIS — M79609 Pain in unspecified limb: Secondary | ICD-10-CM

## 2013-07-01 DIAGNOSIS — R600 Localized edema: Secondary | ICD-10-CM

## 2013-07-01 DIAGNOSIS — M549 Dorsalgia, unspecified: Secondary | ICD-10-CM | POA: Insufficient documentation

## 2013-07-01 DIAGNOSIS — E119 Type 2 diabetes mellitus without complications: Secondary | ICD-10-CM | POA: Insufficient documentation

## 2013-07-01 DIAGNOSIS — R609 Edema, unspecified: Secondary | ICD-10-CM | POA: Insufficient documentation

## 2013-07-01 DIAGNOSIS — Z79899 Other long term (current) drug therapy: Secondary | ICD-10-CM | POA: Insufficient documentation

## 2013-07-01 DIAGNOSIS — R42 Dizziness and giddiness: Secondary | ICD-10-CM | POA: Insufficient documentation

## 2013-07-01 DIAGNOSIS — G4730 Sleep apnea, unspecified: Secondary | ICD-10-CM | POA: Insufficient documentation

## 2013-07-01 DIAGNOSIS — R197 Diarrhea, unspecified: Secondary | ICD-10-CM | POA: Insufficient documentation

## 2013-07-01 DIAGNOSIS — G8929 Other chronic pain: Secondary | ICD-10-CM | POA: Insufficient documentation

## 2013-07-01 DIAGNOSIS — Z7982 Long term (current) use of aspirin: Secondary | ICD-10-CM | POA: Insufficient documentation

## 2013-07-01 DIAGNOSIS — I10 Essential (primary) hypertension: Secondary | ICD-10-CM | POA: Insufficient documentation

## 2013-07-01 LAB — BASIC METABOLIC PANEL
BUN: 13 mg/dL (ref 6–23)
CO2: 26 mEq/L (ref 19–32)
Calcium: 8.9 mg/dL (ref 8.4–10.5)
Chloride: 101 mEq/L (ref 96–112)
Creatinine, Ser: 0.89 mg/dL (ref 0.50–1.35)
GFR calc Af Amer: >90 mL/min (ref >90)
GFR calc non Af Amer: >90 mL/min (ref >90)
Glucose, Bld: 270 mg/dL — ABNORMAL HIGH (ref 70–99)
Potassium: 4.2 mEq/L (ref 3.7–5.3)
Sodium: 140 mEq/L (ref 137–147)

## 2013-07-01 LAB — PROTIME-INR
INR: 1 (ref 0.00–1.49)
Prothrombin Time: 13 seconds (ref 11.6–15.2)

## 2013-07-01 LAB — CBC
HCT: 39.9 % (ref 39.0–52.0)
Hemoglobin: 13 g/dL (ref 13.0–17.0)
MCH: 27.7 pg (ref 26.0–34.0)
MCHC: 32.6 g/dL (ref 30.0–36.0)
MCV: 85.1 fL (ref 78.0–100.0)
Platelets: 342 10*3/uL (ref 150–400)
RBC: 4.69 MIL/uL (ref 4.22–5.81)
RDW: 16.1 % — ABNORMAL HIGH (ref 11.5–15.5)
WBC: 13.8 10*3/uL — ABNORMAL HIGH (ref 4.0–10.5)

## 2013-07-01 LAB — PRO B NATRIURETIC PEPTIDE: Pro B Natriuretic peptide (BNP): 113.6 pg/mL (ref 0–125)

## 2013-07-01 LAB — HEPATIC FUNCTION PANEL
ALT: 15 U/L (ref 0–53)
AST: 14 U/L (ref 0–37)
Albumin: 2.9 g/dL — ABNORMAL LOW (ref 3.5–5.2)
Alkaline Phosphatase: 82 U/L (ref 39–117)
Bilirubin, Direct: <0.2 mg/dL (ref 0.0–0.3)
Total Bilirubin: 0.3 mg/dL (ref 0.3–1.2)
Total Protein: 6.7 g/dL (ref 6.0–8.3)

## 2013-07-01 LAB — I-STAT TROPONIN, ED: Troponin i, poc: 0.01 ng/mL (ref 0.00–0.08)

## 2013-07-01 LAB — TROPONIN I: Troponin I: <0.3 ng/mL (ref <0.30)

## 2013-07-01 MED ORDER — DEXAMETHASONE SODIUM PHOSPHATE 10 MG/ML IJ SOLN
10.0000 mg | Freq: Once | INTRAMUSCULAR | Status: AC
  Administered 2013-07-01: 10 mg via INTRAMUSCULAR
  Filled 2013-07-01: qty 1

## 2013-07-01 MED ORDER — ALBUTEROL SULFATE HFA 108 (90 BASE) MCG/ACT IN AERS
2.0000 | INHALATION_SPRAY | Freq: Once | RESPIRATORY_TRACT | Status: AC
  Administered 2013-07-01: 2 via RESPIRATORY_TRACT
  Filled 2013-07-01: qty 6.7
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
## 2013-07-01 NOTE — Discharge Instructions (Signed)
Read the information below.  You may return to the Emergency Department at any time for worsening condition or any new symptoms that concern you.  If you develop worsening shortness of breath, chest pain, fever, you pass out, or become weak or dizzy, return to the ER for a recheck.    Shortness of Breath Shortness of breath means you have trouble breathing. Shortness of breath may indicate that you have a medical problem. You should seek immediate medical care for shortness of breath. CAUSES   Not enough oxygen in the air (as with high altitudes or a smoke-filled room).  Short-term (acute) lung disease, including:  Infections, such as pneumonia.  Fluid in the lungs, such as heart failure.  A blood clot in the lungs (pulmonary embolism).  Long-term (chronic) lung diseases.  Heart disease (heart attack, angina, heart failure, and others).  Low red blood cells (anemia).  Poor physical fitness. This can cause shortness of breath when you exercise.  Chest or back injuries or stiffness.  Being overweight.  Smoking.  Anxiety. This can make you feel like you are not getting enough air. DIAGNOSIS  Serious medical problems can usually be found during your physical exam. Tests may also be done to determine why you are having shortness of breath. Tests may include:  Chest X-rays.  Lung function tests.  Blood tests.  Electrocardiography.  Exercise testing.  Echocardiography.  Imaging scans. Your caregiver may not be able to find a cause for your shortness of breath after your exam. In this case, it is important to have a follow-up exam with your caregiver as directed.  TREATMENT  Treatment for shortness of breath depends on the cause of your symptoms and can vary greatly. HOME CARE INSTRUCTIONS   Do not smoke. Smoking is a common cause of shortness of breath. If you smoke, ask for help to quit.  Avoid being around chemicals or things that may bother your breathing, such as  paint fumes and dust.  Rest as needed. Slowly resume your usual activities.  If medicines were prescribed, take them as directed for the full length of time directed. This includes oxygen and any inhaled medicines.  Keep all follow-up appointments as directed by your caregiver. SEEK MEDICAL CARE IF:   Your condition does not improve in the time expected.  You have a hard time doing your normal activities even with rest.  You have any side effects or problems with the medicines prescribed.  You develop any new symptoms. SEEK IMMEDIATE MEDICAL CARE IF:   Your shortness of breath gets worse.  You feel lightheaded, faint, or develop a cough not controlled with medicines.  You start coughing up blood.  You have pain with breathing.  You have chest pain or pain in your arms, shoulders, or abdomen.  You have a fever.  You are unable to walk up stairs or exercise the way you normally do. MAKE SURE YOU:  Understand these instructions.  Will watch your condition.  Will get help right away if you are not doing well or get worse. Document Released: 12/05/2000 Document Revised: 09/11/2011 Document Reviewed: 05/28/2011 [facility] Patient Information 2014 [place], [place].   Emergency Department Resource Guide 1) Find a Doctor and Pay Out of Pocket Although you won't have to find out who is covered by your insurance plan, it is a good idea to ask around and get recommendations. You will then need to call the office and see if the doctor you have chosen will accept you as  a new patient and what types of options they offer for patients who are self-pay. Some doctors offer discounts or will set up payment plans for their patients who do not have insurance, but you will need to ask so you aren't surprised when you get to your appointment.  2) Contact Your Local Health Department Not all health departments have doctors that can see patients for sick visits, but many do, so it is worth a call  to see if yours does. If you don't know where your local health department is, you can check in your phone book. The CDC also has a tool to help you locate your state's health department, and many state websites also have listings of all of their local health departments.  3) Find a Walk-in Clinic If your illness is not likely to be very severe or complicated, you may want to try a walk in clinic. These are popping up all over the country in pharmacies, drugstores, and shopping centers. They're usually staffed by nurse practitioners or physician assistants that have been trained to treat common illnesses and complaints. They're usually fairly quick and inexpensive. However, if you have serious medical issues or chronic medical problems, these are probably not your best option.  No Primary Care Doctor: - Call Health Connect at  [phone] - they can help you locate a primary care doctor that  accepts your insurance, provides certain services, etc. - Physician Referral Service- [phone]  Chronic Pain Problems: Organization         Address  Phone   Notes  [facility]  [phone] Patients need to be referred by their primary care doctor.   Medication Assistance: Organization         Address  Phone   Notes  [facility] Medication [facility] [address] [place], [place] [address] [phone] --Must be a resident of [place] [facility] -- Must have NO insurance coverage whatsoever (no Medicaid/ Medicare, etc.) -- The pt. MUST have a primary care doctor that directs their care regularly and follows them in the community   MedAssist  [phone]   [employer]  [phone]    Agencies that provide inexpensive medical care: Organization         Address  Phone   Notes  [facility]  [phone]   [facility]    [phone]   [facility] [address] [place], [place] [address] [phone]  [phone]   [facility] [address], [place] [phone]) [phone]   [facility]anned [facility]    [phone]) [phone]   [facility]    [phone]   [facility]  [address], [place] Phone:  [phone], Fax:  [phone] Hours of Operation:  9 am - 6 pm, M-F.  Also accepts Medicaid/Medicare and self-pay.  [facility]  [address], [place] Phone: [phone], Fax: [phone]. Hours of Operation:  8:30 am - 5:30 pm, M-F.  Also accepts Medicaid and self-pay.  [facility] [place] [address], [place] Phone: [phone]) [phone]   [facility] [address], [place], [place] [phone], Ext. [phone] Mondays & Thursdays: 7-9 AM.  First 15 patients are seen on a first come, first serve basis.    Medicaid-accepting [facility] Providers:  Organization  Address  Phone   Notes  [facility] [address], [place] [phone] Also accepts self-pay patients.  [facility] [id number] [place], [place]  [phone]   [place], [address], [place] [phone]   [facility] [address] [place] [phone]   Lucianne Lei [address], [place]   [phone] Only accepts [place] Access [place] patients after they have their name applied to their card.   Self-Pay (no insurance) in [facility]:  Organization         Address  Phone   Notes  Sickle Cell Patients, [facility] [place] [phone]   [facility] [place] [phone]   [facility] [address]  [place], [place], [place] [phone]   [facility]/Dr. Osei-Bonsu  [address], [place] or [address], [place] [phone] Phone number for both [place] and [place] locations is the same.  [facility]gent [facility] and [facility] [address], [place] [phone]   [facility] [address] [place] or [address] [phone]   [facility] [address], [place] [phone], phone; [phone], fax Sees patients 1st and 3rd Saturday of every month.  Must not qualify for public or private insurance (i.e. Medicaid, Medicare, [place] Health Choice, Veterans' Benefits)  Household income should be no more than 200% of the poverty level The clinic cannot treat you if you are pregnant or think you are pregnant  Sexually transmitted diseases are not treated at the clinic.    Dental Care: Organization         Address  Phone  Notes  [facility] Department of [facility] [place] [phone] Accepts children up to age 47 who are enrolled in [place] or [place]; pregnant women with a Medicaid card; and children who have applied for Medicaid or [place] Health Choice, but were declined, whose parents can pay a reduced fee at time of service.  [facility] Department of [facility]  [address], [place] [phone] Accepts children up to age 35 who are enrolled in [place] or [place]; pregnant women with a Medicaid card; and children who have applied for Medicaid or [place] Health Choice, but were declined, whose parents can pay a reduced fee at time of service.  [facility] PRO[facility]  [place] [phone] Patients are seen by appointment only. Walk-ins are not accepted. [place] will see patients 65 years of age and older. Monday - Tuesday (8am-5pm) Most Wednesdays (8:30-5pm) $30 per visit, cash only  [facility] Adult Dental Access PROGRAM  [address], [facility] [phone] Patients are  seen by appointment only. Walk-ins are not accepted. [place] will see patients 70 years of age and older. One Wednesday Evening (Monthly: Volunteer Based).  $30 per visit, cash only  [place]  [phone] for adults; Children under age 53, call Graduate Pediatric Dentistry at [phone]. Children aged 69-14, please call [phone] to request a pediatric application.  Dental services are provided in all areas of dental care including  fillings, crowns and bridges, complete and partial dentures, implants, gum treatment, root canals, and extractions. Preventive care is also provided. Treatment is provided to both adults and children. Patients are selected via a lottery and there is often a waiting list.   [facility] [address] [place]  [phone] [email or web address]   [facility] [address] [place], [place] [phone], Ext. [phone] Second and Fourth Thursday of each month, opens at 6:30 AM; Clinic ends at 9 AM.  Patients are seen on a first-come first-served basis, and a limited number are seen during each clinic.   [facility]  [address] Hillard Danker [place], [place] [phone]   Eligibility Requirements You must have lived in [place], [place], or [place] counties for at least the last three months.   You cannot be eligible for state or federal sponsored [employer], including [employer], [place], or [employer].   You generally cannot be eligible for healthcare insurance through your employer.    How to apply: Eligibility screenings are held every Tuesday and Wednesday afternoon from 1:00 pm until 4:00 pm. You do not need an appointment for the interview!  [facility] [address], [place], [place]   [place]   [place] Department  [place]  [phone]     Behavioral Health Resources in the Community: Intensive Outpatient Programs Organization         Address  Phone  Notes  [place]. [address] [place], [place] [phone]   [facility] Outpatient [address], [place], [place]   ADS: Alcohol & Drug Svcs [address], [place], [place]   [place] [address]  [place], [place] or [phone]   Substance Abuse Resources Organization         Address  Phone  Notes  Alcohol and Drug Services  [phone]   [place]  [phone]   The [place]   Chinita Pester  [phone]   Residential & Outpatient Substance Abuse Program  [phone]   Psychological Services Organization         Address  Phone  Notes  [facility]  [place]  [phone]   [place] [address], [place] or [phone]    Mobile Crisis Teams Organization         Address  Phone  Notes  [facility], Mobile Crisis Care Unit  [phone]   [facility]  [address] [place], [place]   [place] [address], [place] [phone]    Self-Help/Support Groups Organization         Address  Phone             Notes  [place]. of [place] - variety of support groups  [place] Call for more information  Narcotics Anonymous (NA), [facility] [address], [employer] [place]  2 meetings at this location   Special educational needs teacher         Address  Phone  Notes  [facility] [place],    [place]  [phone]   [facility]  [address] [place] [phone], [place], [place]   [place], [place] [phone] Admissions: 8am-3pm M-F  Incentives  Substance [place] [address],    [place], [place] [phone]   [facility] [address] [place], [place], [place]   [facility] [place] [facility] [address]  [place], [place]   [facility] - Intensive Outpatient [address], [place], [place]   [facility] ([place].,  [place], [place] [phone] or [phone]   [facility] Services (RTS) [address] [place], [place] Accepts Medicaid  [facility] [place] [address],  [place] [phone] Substance Abuse/Addiction Treatment   [facility] Organization         Address  Phone  Notes  [facility]  [phone]   Domenic Schwab, PhD [address] Arlis Porta [place], [place]   [phone] or [phone]   [place], [place] [phone]   [facility] [address] [place], [place] [phone] Insurance/Medicaid/sponsorship through [facility] and Families [address] [place]                                    [place], [place] [phone] [place] [address], [place] [phone]    Dr. Adele Schilder  [phone]   [facility] Dept. 1) [address] [place] 2) [place] 3)  [place] 65, [place] [phone]  [phone]   [place] [phone] or [phone] (After Hours)
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
## 2013-07-01 NOTE — Progress Notes (Signed)
*  PRELIMINARY RESULTS* Vascular Ultrasound Right lower extremity venous duplex has been completed.  Preliminary findings: No evidence of deep vein or superficial thrombosis involving the visualized veins of the right lower extremity. No Baker's cyst on the right. The study was technically limited due to body habitus and depth of vessels.   Real ConsChristy F Deetya Drouillard 07/01/2013, 3:13 PM

## 2013-07-04 NOTE — ED Provider Notes (Signed)
Medical screening examination/treatment/procedure(s) were performed by non-physician practitioner and as supervising physician I was immediately available for consultation/collaboration.   Nelia Shiobert L Sheryll Dymek, MD 07/04/13 1124

## 2013-08-22 ENCOUNTER — Encounter (HOSPITAL_COMMUNITY): Payer: Self-pay | Admitting: Emergency Medicine

## 2013-08-22 DIAGNOSIS — Z7982 Long term (current) use of aspirin: Secondary | ICD-10-CM | POA: Insufficient documentation

## 2013-08-22 DIAGNOSIS — L84 Corns and callosities: Secondary | ICD-10-CM | POA: Insufficient documentation

## 2013-08-22 DIAGNOSIS — I10 Essential (primary) hypertension: Secondary | ICD-10-CM | POA: Insufficient documentation

## 2013-08-22 DIAGNOSIS — R42 Dizziness and giddiness: Secondary | ICD-10-CM | POA: Insufficient documentation

## 2013-08-22 DIAGNOSIS — Z79899 Other long term (current) drug therapy: Secondary | ICD-10-CM | POA: Insufficient documentation

## 2013-08-22 DIAGNOSIS — M549 Dorsalgia, unspecified: Secondary | ICD-10-CM | POA: Insufficient documentation

## 2013-08-22 DIAGNOSIS — R609 Edema, unspecified: Secondary | ICD-10-CM | POA: Insufficient documentation

## 2013-08-22 DIAGNOSIS — G4730 Sleep apnea, unspecified: Secondary | ICD-10-CM | POA: Insufficient documentation

## 2013-08-22 DIAGNOSIS — R0602 Shortness of breath: Secondary | ICD-10-CM | POA: Insufficient documentation

## 2013-08-22 DIAGNOSIS — Z794 Long term (current) use of insulin: Secondary | ICD-10-CM | POA: Insufficient documentation

## 2013-08-22 DIAGNOSIS — L988 Other specified disorders of the skin and subcutaneous tissue: Secondary | ICD-10-CM | POA: Insufficient documentation

## 2013-08-22 DIAGNOSIS — G8929 Other chronic pain: Secondary | ICD-10-CM | POA: Insufficient documentation

## 2013-08-22 DIAGNOSIS — E119 Type 2 diabetes mellitus without complications: Secondary | ICD-10-CM | POA: Insufficient documentation

## 2013-08-22 LAB — CBC WITH DIFFERENTIAL/PLATELET
Basophils Absolute: 0 10*3/uL (ref 0.0–0.1)
Basophils Relative: 0 % (ref 0–1)
Eosinophils Absolute: 0.1 10*3/uL (ref 0.0–0.7)
Eosinophils Relative: 1 % (ref 0–5)
HCT: 40.3 % (ref 39.0–52.0)
Hemoglobin: 13.1 g/dL (ref 13.0–17.0)
Lymphocytes Relative: 34 % (ref 12–46)
Lymphs Abs: 4.7 10*3/uL — ABNORMAL HIGH (ref 0.7–4.0)
MCH: 27.8 pg (ref 26.0–34.0)
MCHC: 32.5 g/dL (ref 30.0–36.0)
MCV: 85.6 fL (ref 78.0–100.0)
Monocytes Absolute: 1.2 10*3/uL — ABNORMAL HIGH (ref 0.1–1.0)
Monocytes Relative: 8 % (ref 3–12)
Neutro Abs: 7.9 10*3/uL — ABNORMAL HIGH (ref 1.7–7.7)
Neutrophils Relative %: 57 % (ref 43–77)
Platelets: 341 10*3/uL (ref 150–400)
RBC: 4.71 MIL/uL (ref 4.22–5.81)
RDW: 15.6 % — ABNORMAL HIGH (ref 11.5–15.5)
WBC: 13.8 10*3/uL — ABNORMAL HIGH (ref 4.0–10.5)

## 2013-08-22 NOTE — ED Notes (Signed)
The pt reports that he has bi-lateral lesions  On the bottoms of his feet for 3 weeks.  He was give a rx from a foot doctor but he could not afford to get it filled.  No temp

## 2013-08-23 ENCOUNTER — Emergency Department (HOSPITAL_COMMUNITY)
Admission: EM | Admit: 2013-08-23 | Discharge: 2013-08-23 | Disposition: A | Discharge status: Home / Self Care | Payer: Medicaid Other | Attending: Emergency Medicine | Admitting: Emergency Medicine

## 2013-08-23 DIAGNOSIS — L84 Corns and callosities: Secondary | ICD-10-CM

## 2013-08-23 DIAGNOSIS — R234 Changes in skin texture: Secondary | ICD-10-CM

## 2013-08-23 LAB — COMPREHENSIVE METABOLIC PANEL
ALT: 17 U/L (ref 0–53)
AST: 14 U/L (ref 0–37)
Albumin: 3.1 g/dL — ABNORMAL LOW (ref 3.5–5.2)
Alkaline Phosphatase: 77 U/L (ref 39–117)
BUN: 20 mg/dL (ref 6–23)
CO2: 24 mEq/L (ref 19–32)
Calcium: 9.3 mg/dL (ref 8.4–10.5)
Chloride: 103 mEq/L (ref 96–112)
Creatinine, Ser: 1.18 mg/dL (ref 0.50–1.35)
GFR calc Af Amer: 82 mL/min — ABNORMAL LOW (ref >90)
GFR calc non Af Amer: 71 mL/min — ABNORMAL LOW (ref >90)
Glucose, Bld: 164 mg/dL — ABNORMAL HIGH (ref 70–99)
Potassium: 4 mEq/L (ref 3.7–5.3)
Sodium: 141 mEq/L (ref 137–147)
Total Bilirubin: 0.3 mg/dL (ref 0.3–1.2)
Total Protein: 7.3 g/dL (ref 6.0–8.3)
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
## 2013-08-23 NOTE — ED Notes (Addendum)
EDP at bedside, used suture removal kit to remove small area of scap/crust on patient left heel. Area cleaned, used bacitracin and bandage applied to left heel.

## 2013-08-23 NOTE — Discharge Instructions (Signed)

## 2013-08-23 NOTE — ED Notes (Addendum)
Pt states that he noticed bilateral heel scabs that started forming a week ago. Pt states that they then turned crusted and he noticed they were peeling and then they started bleeding. Pt able to wiggle toes cap refill less than 3. Pt ambulatory.

## 2013-08-25 ENCOUNTER — Emergency Department (INDEPENDENT_AMBULATORY_CARE_PROVIDER_SITE_OTHER)
Admission: EM | Admit: 2013-08-25 | Discharge: 2013-08-25 | Disposition: A | Discharge status: Home / Self Care | Payer: Medicaid Other | Source: Home / Self Care | Attending: Emergency Medicine | Admitting: Emergency Medicine

## 2013-08-25 ENCOUNTER — Encounter (HOSPITAL_COMMUNITY): Payer: Self-pay | Admitting: Emergency Medicine

## 2013-08-25 DIAGNOSIS — L84 Corns and callosities: Secondary | ICD-10-CM

## 2013-08-25 NOTE — ED Notes (Signed)
Patient complains of left and right foot pain with left worse than right; states callouses are starting to peel and bleeding on heels of both feet.
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
## 2013-08-25 NOTE — Discharge Instructions (Signed)

## 2013-08-25 NOTE — ED Provider Notes (Signed)
Medical screening examination/treatment/procedure(s) were performed by non-physician practitioner and as supervising physician I was immediately available for consultation/collaboration.  Leslee Home, M.D.  Reuben Likes, MD 08/25/13 2227

## 2014-10-22 ENCOUNTER — Encounter (HOSPITAL_COMMUNITY): Payer: Self-pay | Admitting: Emergency Medicine

## 2014-10-22 ENCOUNTER — Emergency Department (HOSPITAL_COMMUNITY)
Admission: EM | Admit: 2014-10-22 | Discharge: 2014-10-22 | Disposition: A | Discharge status: Home / Self Care | Payer: Medicaid Other | Attending: Emergency Medicine | Admitting: Emergency Medicine

## 2014-10-22 DIAGNOSIS — Z87828 Personal history of other (healed) physical injury and trauma: Secondary | ICD-10-CM | POA: Diagnosis not present

## 2014-10-22 DIAGNOSIS — Z794 Long term (current) use of insulin: Secondary | ICD-10-CM | POA: Insufficient documentation

## 2014-10-22 DIAGNOSIS — G8929 Other chronic pain: Secondary | ICD-10-CM | POA: Insufficient documentation

## 2014-10-22 DIAGNOSIS — I10 Essential (primary) hypertension: Secondary | ICD-10-CM | POA: Diagnosis not present

## 2014-10-22 DIAGNOSIS — E119 Type 2 diabetes mellitus without complications: Secondary | ICD-10-CM | POA: Insufficient documentation

## 2014-10-22 DIAGNOSIS — R21 Rash and other nonspecific skin eruption: Secondary | ICD-10-CM | POA: Insufficient documentation

## 2014-10-22 DIAGNOSIS — Z79899 Other long term (current) drug therapy: Secondary | ICD-10-CM | POA: Diagnosis not present

## 2014-10-22 DIAGNOSIS — Z7982 Long term (current) use of aspirin: Secondary | ICD-10-CM | POA: Insufficient documentation

## 2014-10-22 MED ORDER — HYDROCORTISONE 1 % EX CREA: 1.0000 "application " | TOPICAL_CREAM | Freq: Two times a day (BID) | CUTANEOUS | Status: DC

## 2014-10-22 NOTE — ED Notes (Signed)
Started last pm with itching rash to back.
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
## 2014-10-22 NOTE — Discharge Instructions (Signed)

## 2014-12-16 ENCOUNTER — Encounter (HOSPITAL_COMMUNITY): Payer: Self-pay | Admitting: Emergency Medicine

## 2014-12-16 ENCOUNTER — Emergency Department (HOSPITAL_COMMUNITY)
Admission: EM | Admit: 2014-12-16 | Discharge: 2014-12-16 | Disposition: A | Discharge status: Home / Self Care | Payer: Medicaid Other | Attending: Emergency Medicine | Admitting: Emergency Medicine

## 2014-12-16 DIAGNOSIS — I10 Essential (primary) hypertension: Secondary | ICD-10-CM | POA: Diagnosis not present

## 2014-12-16 DIAGNOSIS — Z794 Long term (current) use of insulin: Secondary | ICD-10-CM | POA: Diagnosis not present

## 2014-12-16 DIAGNOSIS — Y9289 Other specified places as the place of occurrence of the external cause: Secondary | ICD-10-CM | POA: Diagnosis not present

## 2014-12-16 DIAGNOSIS — E119 Type 2 diabetes mellitus without complications: Secondary | ICD-10-CM | POA: Insufficient documentation

## 2014-12-16 DIAGNOSIS — Z7982 Long term (current) use of aspirin: Secondary | ICD-10-CM | POA: Insufficient documentation

## 2014-12-16 DIAGNOSIS — X58XXXA Exposure to other specified factors, initial encounter: Secondary | ICD-10-CM | POA: Insufficient documentation

## 2014-12-16 DIAGNOSIS — G8929 Other chronic pain: Secondary | ICD-10-CM | POA: Insufficient documentation

## 2014-12-16 DIAGNOSIS — S29012A Strain of muscle and tendon of back wall of thorax, initial encounter: Secondary | ICD-10-CM | POA: Diagnosis not present

## 2014-12-16 DIAGNOSIS — S299XXA Unspecified injury of thorax, initial encounter: Secondary | ICD-10-CM | POA: Diagnosis present

## 2014-12-16 DIAGNOSIS — Z79899 Other long term (current) drug therapy: Secondary | ICD-10-CM | POA: Insufficient documentation

## 2014-12-16 DIAGNOSIS — Y998 Other external cause status: Secondary | ICD-10-CM | POA: Diagnosis not present

## 2014-12-16 DIAGNOSIS — Z8669 Personal history of other diseases of the nervous system and sense organs: Secondary | ICD-10-CM | POA: Diagnosis not present

## 2014-12-16 DIAGNOSIS — Y9389 Activity, other specified: Secondary | ICD-10-CM | POA: Insufficient documentation

## 2014-12-16 MED ORDER — IBUPROFEN 600 MG PO TABS: 600.0000 mg | ORAL_TABLET | Freq: Four times a day (QID) | ORAL | Status: DC | PRN

## 2014-12-16 MED ORDER — IBUPROFEN 800 MG PO TABS
800.0000 mg | ORAL_TABLET | Freq: Once | ORAL | Status: AC
  Administered 2014-12-16: 800 mg via ORAL
  Filled 2014-12-16: qty 1

## 2014-12-16 MED ORDER — ONDANSETRON 4 MG PO TBDP
4.0000 mg | ORAL_TABLET | Freq: Once | ORAL | Status: AC
  Administered 2014-12-16: 4 mg via ORAL
  Filled 2014-12-16: qty 1
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
## 2014-12-16 NOTE — ED Notes (Signed)
Pt arrives via POV from workplace where patient was lifting heavy iced tea container and began having right middle back pain. Pt reports the pain is making him SOB and vomited x1.

## 2014-12-16 NOTE — Discharge Instructions (Signed)

## 2015-02-20 ENCOUNTER — Ambulatory Visit (HOSPITAL_BASED_OUTPATIENT_CLINIC_OR_DEPARTMENT_OTHER): Payer: Medicaid Other | Attending: Physician Assistant | Admitting: *Deleted

## 2015-02-20 VITALS — Ht 71.0 in | Wt 375.0 lb

## 2015-02-20 DIAGNOSIS — R0683 Snoring: Secondary | ICD-10-CM | POA: Diagnosis not present

## 2015-02-20 DIAGNOSIS — G4733 Obstructive sleep apnea (adult) (pediatric): Secondary | ICD-10-CM | POA: Diagnosis not present

## 2015-02-20 DIAGNOSIS — Z6841 Body Mass Index (BMI) 40.0 and over, adult: Secondary | ICD-10-CM | POA: Insufficient documentation

## 2015-02-20 DIAGNOSIS — G4730 Sleep apnea, unspecified: Secondary | ICD-10-CM | POA: Diagnosis present

## 2015-02-20 DIAGNOSIS — E669 Obesity, unspecified: Secondary | ICD-10-CM | POA: Insufficient documentation

## 2015-02-20 DIAGNOSIS — G4736 Sleep related hypoventilation in conditions classified elsewhere: Secondary | ICD-10-CM | POA: Diagnosis not present

## 2015-02-26 DIAGNOSIS — G4733 Obstructive sleep apnea (adult) (pediatric): Secondary | ICD-10-CM | POA: Diagnosis not present

## 2015-02-26 NOTE — Progress Notes (Signed)
Patient Name: Gary Franco, Gary Franco Date: 02/20/2015 Gender: Male D.O.B: [birth year] Age (years): 49 Referring Provider: Darnelle Catalan Height (inches): 71 Interpreting Physician: Jetty Duhamel MD, ABSM Weight (lbs): 388 RPSGT: Elaina Pattee BMI: 54 MRN: [id number] Neck Size: 20.00 CLINICAL INFORMATION Sleep Study Type: NPSG   Indication for sleep study: Obesity, Re-Evaluation, Witnesses Apnea / Gasping During Sleep   Epworth Sleepiness Score:14/24   SLEEP STUDY TECHNIQUE As per the AASM Manual for the Scoring of Sleep and Associated Events v2.3 (April 2016) with a hypopnea requiring 4% desaturations. The channels recorded and monitored were frontal, central and occipital EEG, electrooculogram (EOG), submentalis EMG (chin), nasal and oral airflow, thoracic and abdominal wall motion, anterior tibialis EMG, snore microphone, electrocardiogram, and pulse oximetry. MEDICATIONS Patient's medications include: charted for review Medications self-administered by patient during sleep study : No sleep medicine administered.  SLEEP ARCHITECTURE The study was initiated at 9:04:10 PM and ended at 3:05:45 AM. Patient needed to end at 3:00 AM in order to be at work by 4:00 AM Sleep onset time was 23.2 minutes and the sleep efficiency was 88.6%. The total sleep time was 320.5 minutes. Stage REM latency was 71.5 minutes. The patient spent 1.72% of the night in stage N1 sleep, 79.72% in stage N2 sleep, 0.16% in stage N3 and 18.41% in REM. Alpha intrusion was absent. Supine sleep was 96.72%.  RESPIRATORY PARAMETERS The overall apnea/hypopnea index (AHI) was 11.6 per hour. There were 8 total apneas, including 7 obstructive, 1 central and 0 mixed apneas. There were 54 hypopneas and 4 RERAs. The AHI during Stage REM sleep was 39.7 per hour. AHI while supine was 12.0 per hour. The mean oxygen saturation was 92.11%. The minimum SpO2 during sleep was 80.00%. Moderate snoring was noted during this  study.  CARDIAC DATA The 2 lead EKG demonstrated sinus rhythm. The mean heart rate was 74.94 beats per minute. Other EKG findings include: None.  LEG MOVEMENT DATA The total PLMS were 0 with a resulting PLMS index of 0.00. Associated arousal with leg movement index was 0.0 .  IMPRESSIONS - Mild obstructive sleep apnea occurred during this study (AHI = 11.6/h). - No significant central sleep apnea occurred during this study (CAI = 0.2/h). - Moderate oxygen desaturation was noted during this study (Min O2 = 80.00%). - The patient snored with Moderate snoring volume. - No cardiac abnormalities were noted during this study. - Clinically significant periodic limb movements did not occur during sleep. No significant associated arousals. - The patient needed to be awake at 3:00 AM in order to be at work by 4:00AM. There may be problems with insufficient sleep and shift-work sleep disturbances contributing to excessive sleepiness.  DIAGNOSIS - Obstructive Sleep Apnea (327.23 [G47.33 ICD-10]) - Nocturnal Hypoxemia (327.26 [G47.36 ICD-10])  RECOMMENDATIONS - Therapeutic CPAP titration to determine optimal pressure required to alleviate sleep disordered breathing. An oral appliance, or possibly surgical evaluation, might be alternatives. - Positional therapy avoiding supine position during sleep. - Avoid alcohol, sedatives and other CNS depressants that may worsen sleep apnea and disrupt normal sleep architecture. - Sleep hygiene should be reviewed to assess factors that may improve sleep quality. - Weight management and regular exercise should be initiated or continued if appropriate.  Waymon Budge Diplomate, American Board of Sleep Medicine  ELECTRONICALLY SIGNED ON:  02/26/2015, 10:58 AM [facility] PH: [phone]   FX: [phone] ACCREDITED BY THE AMERICAN ACADEMY OF SLEEP MEDICINE

## 2015-03-22 ENCOUNTER — Emergency Department (HOSPITAL_COMMUNITY)
Admission: EM | Admit: 2015-03-22 | Discharge: 2015-03-22 | Discharge status: Against Medical Advice | Payer: Medicaid Other | Attending: Emergency Medicine | Admitting: Emergency Medicine

## 2015-03-22 ENCOUNTER — Encounter (HOSPITAL_COMMUNITY): Payer: Self-pay | Admitting: *Deleted

## 2015-03-22 DIAGNOSIS — R21 Rash and other nonspecific skin eruption: Secondary | ICD-10-CM | POA: Insufficient documentation

## 2015-03-22 DIAGNOSIS — E119 Type 2 diabetes mellitus without complications: Secondary | ICD-10-CM | POA: Insufficient documentation

## 2015-03-22 DIAGNOSIS — I10 Essential (primary) hypertension: Secondary | ICD-10-CM | POA: Diagnosis not present

## 2015-03-22 NOTE — ED Notes (Signed)
The pt reports that he caanot wait he is leaving now.  Cautioned but he left anyway

## 2015-03-22 NOTE — ED Notes (Signed)
The pt has a rash acorss his lr posterior chest that started 2 days ago  When he became sob from the pain and itching across the rash.  It appeARS TO BE SHINGLES/  THE PT IS AWARE BUT NOW HES TELLING MME HE IS GOING TO WORK  HE WAS JUST CALLED IN

## 2015-05-25 ENCOUNTER — Ambulatory Visit (HOSPITAL_BASED_OUTPATIENT_CLINIC_OR_DEPARTMENT_OTHER): Payer: Medicaid Other | Attending: Internal Medicine | Admitting: Radiology

## 2015-05-25 VITALS — Ht 71.0 in | Wt 365.0 lb

## 2015-05-25 DIAGNOSIS — G4730 Sleep apnea, unspecified: Secondary | ICD-10-CM | POA: Diagnosis present

## 2015-05-25 DIAGNOSIS — G4733 Obstructive sleep apnea (adult) (pediatric): Secondary | ICD-10-CM | POA: Insufficient documentation

## 2015-05-28 NOTE — Progress Notes (Signed)
Patient Name: Gary Franco, Gary Franco Date: 05/25/2015 Gender: Male D.O.B: 04-01-1965 Age (years): 49 Referring Provider: Darnelle Catalan Height (inches): 71 Interpreting Physician: Jetty Duhamel MD, ABSM Weight (lbs): 365 RPSGT: Armen Pickup BMI: 51 MRN: [id number] Neck Size: 19.00 CLINICAL INFORMATION The patient is referred for a CPAP titration to treat sleep apnea.   Date of NPSG, Split Night or HST: Diagnostic NPSG 02/20/15- AHI 11.6/ hr, desaturation to 80%, bosy weight 388 lbs  SLEEP STUDY TECHNIQUE As per the AASM Manual for the Scoring of Sleep and Associated Events v2.3 (April 2016) with a hypopnea requiring 4% desaturations. The channels recorded and monitored were frontal, central and occipital EEG, electrooculogram (EOG), submentalis EMG (chin), nasal and oral airflow, thoracic and abdominal wall motion, anterior tibialis EMG, snore microphone, electrocardiogram, and pulse oximetry. Continuous positive airway pressure (CPAP) was initiated at the beginning of the study and titrated to treat sleep-disordered breathing.  MEDICATIONS Medications taken by the patient : charted for review Medications administered by patient during sleep study : No sleep medicine administered.  TECHNICIAN COMMENTS Comments added by technician: NO MEDICATION WAS TAKEN. NO RESTROOM VISTED. CPAP PRESSURE STARTED AT 7 CM H20 AND TITRATED TO A PRESSURE OF 10 CM H20 WITH HEATED HUMIDITY AND HEATED TUBBING.  Comments added by scorer: N/A  RESPIRATORY PARAMETERS Optimal PAP Pressure (cm): 10 AHI at Optimal Pressure (/hr): 0 Overall Minimal O2 (%): 81.00 Supine % at Optimal Pressure (%): N/A Minimal O2 at Optimal Pressure (%): 81.00      SLEEP ARCHITECTURE The study was initiated at 9:42:33 PM and ended at 3:48:29 AM. Sleep onset time was 0.4 minutes and the sleep efficiency was 98.8%. The total sleep time was 361.5 minutes. The patient spent 2.07% of the night in stage N1 sleep, 57.95% in stage N2  sleep, 12.45% in stage N3 and 27.52% in REM.Stage REM latency was 50.5 minutes Wake after sleep onset was 4.0. Alpha intrusion was absent. Supine sleep was 100.00%.  CARDIAC DATA The 2 lead EKG demonstrated sinus rhythm. The mean heart rate was 60.20 beats per minute. Other EKG findings include: None.  LEG MOVEMENT DATA The total Periodic Limb Movements of Sleep (PLMS) were 0. The PLMS index was 0.00. A PLMS index of <15 is considered normal in adults.  IMPRESSIONS - The optimal PAP pressure was 10 cm of water. - Central sleep apnea was not noted during this titration (CAI = 0.0/h). - Moderate oxygen desaturations were observed during this titration (min O2 = 81.00%). - No snoring was audible during this study. - No cardiac abnormalities were observed during this study. - Clinically significant periodic limb movements were not noted during this study. Arousals associated with PLMs were rare.  DIAGNOSIS - Obstructive Sleep Apnea (327.23 [G47.33 ICD-10])  RECOMMENDATIONS - Trial of CPAP therapy on 10 cm H2O with a Standard size Fisher&Paykel Nasal Pillow Mask [place] Q mask and heated humidification. - Avoid alcohol, sedatives and other CNS depressants that may worsen sleep apnea and disrupt normal sleep architecture. - Sleep hygiene should be reviewed to assess factors that may improve sleep quality. - Weight management and regular exercise should be initiated or continued.  Waymon Budge Diplomate, American Board of Sleep Medicine  ELECTRONICALLY SIGNED ON:  05/28/2015, 11:58 AM [facility] PH: [phone]   FX: [phone] ACCREDITED BY THE AMERICAN ACADEMY OF SLEEP MEDICINE

## 2015-09-06 ENCOUNTER — Encounter: Payer: Self-pay | Admitting: Gastroenterology

## 2015-11-02 ENCOUNTER — Ambulatory Visit: Payer: Self-pay | Admitting: Gastroenterology

## 2015-11-21 ENCOUNTER — Encounter (HOSPITAL_COMMUNITY): Payer: Self-pay

## 2015-11-21 ENCOUNTER — Emergency Department (HOSPITAL_COMMUNITY): Payer: Medicaid Other

## 2015-11-21 DIAGNOSIS — E119 Type 2 diabetes mellitus without complications: Secondary | ICD-10-CM | POA: Diagnosis not present

## 2015-11-21 DIAGNOSIS — Z79899 Other long term (current) drug therapy: Secondary | ICD-10-CM | POA: Diagnosis not present

## 2015-11-21 DIAGNOSIS — M545 Low back pain: Secondary | ICD-10-CM | POA: Diagnosis not present

## 2015-11-21 DIAGNOSIS — K5909 Other constipation: Secondary | ICD-10-CM | POA: Insufficient documentation

## 2015-11-21 DIAGNOSIS — Z7982 Long term (current) use of aspirin: Secondary | ICD-10-CM | POA: Insufficient documentation

## 2015-11-21 DIAGNOSIS — R0789 Other chest pain: Secondary | ICD-10-CM | POA: Insufficient documentation

## 2015-11-21 DIAGNOSIS — I10 Essential (primary) hypertension: Secondary | ICD-10-CM | POA: Diagnosis not present

## 2015-11-21 DIAGNOSIS — Z794 Long term (current) use of insulin: Secondary | ICD-10-CM | POA: Insufficient documentation

## 2015-11-21 DIAGNOSIS — R079 Chest pain, unspecified: Secondary | ICD-10-CM | POA: Diagnosis present

## 2015-11-21 LAB — BASIC METABOLIC PANEL
Anion gap: 9 (ref 5–15)
BUN: 10 mg/dL (ref 6–20)
CO2: 26 mmol/L (ref 22–32)
Calcium: 9.3 mg/dL (ref 8.9–10.3)
Chloride: 104 mmol/L (ref 101–111)
Creatinine, Ser: 0.91 mg/dL (ref 0.61–1.24)
GFR calc Af Amer: >60 mL/min (ref >60)
GFR calc non Af Amer: >60 mL/min (ref >60)
Glucose, Bld: 154 mg/dL — ABNORMAL HIGH (ref 65–99)
Potassium: 4.1 mmol/L (ref 3.5–5.1)
Sodium: 139 mmol/L (ref 135–145)

## 2015-11-21 LAB — URINE MICROSCOPIC-ADD ON

## 2015-11-21 LAB — CBC
HCT: 45.5 % (ref 39.0–52.0)
Hemoglobin: 14.5 g/dL (ref 13.0–17.0)
MCH: 27.5 pg (ref 26.0–34.0)
MCHC: 31.9 g/dL (ref 30.0–36.0)
MCV: 86.3 fL (ref 78.0–100.0)
Platelets: 400 10*3/uL (ref 150–400)
RBC: 5.27 MIL/uL (ref 4.22–5.81)
RDW: 16 % — ABNORMAL HIGH (ref 11.5–15.5)
WBC: 12.8 10*3/uL — ABNORMAL HIGH (ref 4.0–10.5)

## 2015-11-21 LAB — URINALYSIS, ROUTINE W REFLEX MICROSCOPIC
Bilirubin Urine: NEGATIVE
Glucose, UA: NEGATIVE mg/dL
Hgb urine dipstick: NEGATIVE
Ketones, ur: NEGATIVE mg/dL
Leukocytes, UA: NEGATIVE
Nitrite: NEGATIVE
Protein, ur: 30 mg/dL — AB
Specific Gravity, Urine: 1.017 (ref 1.005–1.030)
pH: 7.5 (ref 5.0–8.0)

## 2015-11-21 NOTE — ED Triage Notes (Signed)
Pt complaining of low back pain that radiates to chest. Pt states pain increases with motion.

## 2015-11-22 ENCOUNTER — Emergency Department (HOSPITAL_COMMUNITY)
Admission: EM | Admit: 2015-11-22 | Discharge: 2015-11-22 | Disposition: A | Discharge status: Home / Self Care | Payer: Medicaid Other | Attending: Emergency Medicine | Admitting: Emergency Medicine

## 2015-11-22 ENCOUNTER — Emergency Department (HOSPITAL_COMMUNITY): Payer: Medicaid Other

## 2015-11-22 DIAGNOSIS — R0789 Other chest pain: Secondary | ICD-10-CM

## 2015-11-22 DIAGNOSIS — M545 Low back pain: Secondary | ICD-10-CM

## 2015-11-22 DIAGNOSIS — K5909 Other constipation: Secondary | ICD-10-CM

## 2015-11-22 LAB — I-STAT TROPONIN, ED: Troponin i, poc: 0 ng/mL (ref 0.00–0.08)

## 2015-11-22 MED ORDER — POLYETHYLENE GLYCOL 3350 17 G PO PACK: 17.0000 g | PACK | Freq: Two times a day (BID) | ORAL | 0 refills | Status: DC

## 2015-11-22 MED ORDER — NAPROXEN 500 MG PO TABS: 500.0000 mg | ORAL_TABLET | Freq: Two times a day (BID) | ORAL | 0 refills | Status: DC

## 2015-11-22 MED ORDER — MORPHINE SULFATE (PF) 4 MG/ML IV SOLN
4.0000 mg | Freq: Once | INTRAVENOUS | Status: AC
Start: 2015-11-22 — End: 2015-11-22
  Administered 2015-11-22: 4 mg via INTRAVENOUS
  Filled 2015-11-22: qty 1

## 2015-11-22 MED ORDER — IOPAMIDOL (ISOVUE-300) INJECTION 61%
INTRAVENOUS | Status: AC
  Administered 2015-11-22: 100 mL
  Filled 2015-11-22: qty 100

## 2015-11-22 MED ORDER — ONDANSETRON HCL 4 MG/2ML IJ SOLN
4.0000 mg | Freq: Once | INTRAMUSCULAR | Status: AC
  Administered 2015-11-22: 4 mg via INTRAVENOUS
  Filled 2015-11-22: qty 2
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
## 2015-11-22 NOTE — Discharge Instructions (Signed)
You were seen today for back pain and chest pain as well as constipation. Her workup is largely reassuring. You need to follow-up with her primary physician.  You should also be evaluated by cardiology if chest pain or shortness of breath persists.

## 2015-11-23 ENCOUNTER — Emergency Department (HOSPITAL_COMMUNITY): Payer: Medicaid Other

## 2015-11-23 ENCOUNTER — Encounter (HOSPITAL_COMMUNITY): Payer: Self-pay

## 2015-11-23 ENCOUNTER — Emergency Department (HOSPITAL_COMMUNITY)
Admission: EM | Admit: 2015-11-23 | Discharge: 2015-11-23 | Disposition: A | Discharge status: Home / Self Care | Payer: Medicaid Other | Attending: Emergency Medicine | Admitting: Emergency Medicine

## 2015-11-23 DIAGNOSIS — K5900 Constipation, unspecified: Secondary | ICD-10-CM | POA: Insufficient documentation

## 2015-11-23 DIAGNOSIS — R110 Nausea: Secondary | ICD-10-CM | POA: Diagnosis not present

## 2015-11-23 DIAGNOSIS — Z79899 Other long term (current) drug therapy: Secondary | ICD-10-CM | POA: Insufficient documentation

## 2015-11-23 DIAGNOSIS — M546 Pain in thoracic spine: Secondary | ICD-10-CM | POA: Insufficient documentation

## 2015-11-23 DIAGNOSIS — I10 Essential (primary) hypertension: Secondary | ICD-10-CM | POA: Insufficient documentation

## 2015-11-23 DIAGNOSIS — Z7982 Long term (current) use of aspirin: Secondary | ICD-10-CM | POA: Insufficient documentation

## 2015-11-23 DIAGNOSIS — E119 Type 2 diabetes mellitus without complications: Secondary | ICD-10-CM | POA: Insufficient documentation

## 2015-11-23 MED ORDER — ONDANSETRON 4 MG PO TBDP: 4.0000 mg | ORAL_TABLET | Freq: Three times a day (TID) | ORAL | 0 refills | Status: DC | PRN

## 2015-11-23 MED ORDER — DOCUSATE SODIUM 100 MG PO CAPS: 100.0000 mg | ORAL_CAPSULE | Freq: Two times a day (BID) | ORAL | 0 refills | Status: DC | PRN

## 2015-11-23 MED ORDER — MILK AND MOLASSES ENEMA
1.0000 | Freq: Once | RECTAL | Status: AC
  Administered 2015-11-23: 250 mL via RECTAL
  Filled 2015-11-23: qty 250

## 2015-11-23 MED ORDER — ONDANSETRON 4 MG PO TBDP
8.0000 mg | ORAL_TABLET | Freq: Once | ORAL | Status: AC
  Administered 2015-11-23: 8 mg via ORAL
  Filled 2015-11-23: qty 2
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
## 2015-11-23 NOTE — Discharge Instructions (Addendum)
Your back pain is likely due to your constipation. You should continue taking miralax TWICE daily until you achieve daily soft stools-- you can use more than 2 doses daily in order to achieve the first bowel movement and then cut back to 1-2 times daily (or every other day, or however you need to adjust to continue having the soft daily stools). Start taking colace in addition to help soften the stool that's in your colon, and make it easier to pass, but you can stop taking colace once your stools return to normal frequency and consistency. Increase the fiber and water intake in your diet. See the information below regarding improving your bowel health. Use naprosyn that was given to you yesterday as needed for pain, using tylenol for additional pain relief. Consider using heat to the area of pain in order to help with pain. Use zofran as needed for nausea. Follow up with your regular doctor in 3 days for recheck of symptoms. Return to the ER for changes or worsening symptoms.   GETTING TO GOOD BOWEL HEALTH. Irregular bowel habits such as constipation and diarrhea can lead to many problems over time.  Having one soft bowel movement a day is the most important way to prevent further problems.  The anorectal canal is designed to handle stretching and feces to safely manage our ability to get rid of solid waste (feces, poop, stool) out of our body.  BUT, hard constipated stools can act like ripping concrete bricks and diarrhea can be a burning fire to this very sensitive area of our body, causing inflamed hemorrhoids, anal fissures, increasing risk is perirectal abscesses, abdominal pain/bloating, an making irritable bowel worse.     The goal: ONE SOFT BOWEL MOVEMENT A DAY!  To have soft, regular bowel movements:  Drink at least 8 tall glasses of water a day.   Take plenty of fiber.  Fiber is the undigested part of plant food that passes into the colon, acting s ?natures broom? to encourage bowel motility and  movement.  Fiber can absorb and hold large amounts of water. This results in a larger, bulkier stool, which is soft and easier to pass. Work gradually over several weeks up to 6 servings a day of fiber (25g a day even more if needed) in the form of: Vegetables -- Root (potatoes, carrots, turnips), leafy green (lettuce, salad greens, celery, spinach), or cooked high residue (cabbage, broccoli, etc) Fruit -- Fresh (unpeeled skin & pulp), Dried (prunes, apricots, cherries, etc ),  or stewed ( applesauce)  Whole grain breads, pasta, etc (whole wheat)  Bran cereals  Bulking Agents -- This type of water-retaining fiber generally is easily obtained each day by one of the following:  Psyllium bran -- The psyllium plant is remarkable because its ground seeds can retain so much water. This product is available as Metamucil, Konsyl, Effersyllium, Per Diem Fiber, or the less expensive generic preparation in drug and health food stores. Although labeled a laxative, it really is not a laxative.  Methylcellulose -- This is another fiber derived from wood which also retains water. It is available as Citrucel. Polyethylene Glycol - and ?artificial? fiber commonly called Miralax or Glycolax.  It is helpful for people with gassy or bloated feelings with regular fiber Flax Seed - a less gassy fiber than psyllium No reading or other relaxing activity while on the toilet. If bowel movements take longer than 5 minutes, you are too constipated AVOID CONSTIPATION.  High fiber and water intake usually  takes care of this.  Sometimes a laxative is needed to stimulate more frequent bowel movements, but  Laxatives are not a good long-term solution as it can wear the colon out. Osmotics (Milk of Magnesia, Fleets phosphosoda, Magnesium citrate, MiraLax, GoLytely) are safer than  Stimulants (Senokot, Castor Oil, Dulcolax, Ex Lax)    Do not take laxatives for more than 7days in a row.  IF SEVERELY CONSTIPATED, try a Bowel Retraining  Program: Do not use laxatives.  Eat a diet high in roughage, such as bran cereals and leafy vegetables.  Drink six (6) ounces of prune or apricot juice each morning.  Eat two (2) large servings of stewed fruit each day.  Take one (1) heaping tablespoon of a psyllium-based bulking agent twice a day. Use sugar-free sweetener when possible to avoid excessive calories.  Eat a normal breakfast.  Set aside 15 minutes after breakfast to sit on the toilet, but do not strain to have a bowel movement.  If you do not have a bowel movement by the third day, use an enema and repeat the above steps.  Controlling diarrhea Switch to liquids and simpler foods for a few days to avoid stressing your intestines further. Avoid dairy products (especially milk & ice cream) for a short time.  The intestines often can lose the ability to digest lactose when stressed. Avoid foods that cause gassiness or bloating.  Typical foods include beans and other legumes, cabbage, broccoli, and dairy foods.  Every person has some sensitivity to other foods, so listen to our body and avoid those foods that trigger problems for you. Adding fiber (Citrucel, Metamucil, psyllium, Miralax) gradually can help thicken stools by absorbing excess fluid and retrain the intestines to act more normally.  Slowly increase the dose over a few weeks.  Too much fiber too soon can backfire and cause cramping & bloating. Probiotics (such as active yogurt, Align, etc) may help repopulate the intestines and colon with normal bacteria and calm down a sensitive digestive tract.  Most studies show it to be of mild help, though, and such products can be costly. Medicines: Bismuth subsalicylate (ex. Kayopectate, Pepto Bismol) every 30 minutes for up to 6 doses can help control diarrhea.  Avoid if pregnant. Loperamide (Immodium) can slow down diarrhea.  Start with two tablets (4mg  total) first and then try one tablet every 6 hours.  Avoid if you are having fevers  or severe pain.  If you are not better or start feeling worse, stop all medicines and call your doctor for advice Call your doctor if you are getting worse or not better.  Sometimes further testing (cultures, endoscopy, X-ray studies, bloodwork, etc) may be needed to help diagnose and treat the cause of the diarrhea.  Managing Pain  Pain after surgery or related to activity is often due to strain/injury to muscle, tendon, nerves and/or incisions.  This pain is usually short-term and will improve in a few months.   Many people find it helpful to do the following things TOGETHER to help speed the process of healing and to get back to regular activity more quickly:  Avoid heavy physical activity  no lifting greater than 20 pounds Do not ?push through? the pain.  Listen to your body and avoid positions and maneuvers than reproduce the pain Walking is okay as tolerated, but go slowly and stop when getting sore.  Remember: If it hurts to do it, then don?t do it! Take Anti-inflammatory medication  Take with food/snack around the  clock for 1-2 weeks This helps the muscle and nerve tissues become less irritable and calm down faster Choose ONE of the following over-the-counter medications: Naproxen 220mg  tabs (ex. Aleve) 1-2 pills twice a day  Ibuprofen 200mg  tabs (ex. Advil, Motrin) 3-4 pills with every meal and just before bedtime Acetaminophen 500mg  tabs (Tylenol) 1-2 pills with every meal and just before bedtime Use a Heating pad or Ice/Cold Pack 4-6 times a day May use warm bath/hottub  or showers Try Gentle Massage and/or Stretching  at the area of pain many times a day stop if you feel pain - do not overdo it  Try these steps together to help you body heal faster and avoid making things get worse.  Doing just one of these things may not be enough.    If you are not getting better after two weeks or are noticing you are getting worse, contact our office for further advice; we may need to  re-evaluate you & see what other things we can do to help.

## 2015-11-23 NOTE — ED Triage Notes (Signed)
Per pT, Pt is coming in to be seen by continuing back pain. Pt was seen and evaluated two days ago with lower back pain. Reports being given medication and sent home after blood work and Xrays. Pt reports now having constipation and being unable to have a bowel movement.

## 2015-11-23 NOTE — ED Notes (Signed)
Bedside commode placed at bedside.  

## 2015-12-01 ENCOUNTER — Ambulatory Visit (HOSPITAL_COMMUNITY)
Admission: RE | Admit: 2015-12-01 | Discharge: 2015-12-01 | Disposition: A | Discharge status: Home / Self Care | Payer: Medicaid Other | Source: Ambulatory Visit | Attending: Internal Medicine | Admitting: Internal Medicine

## 2015-12-01 ENCOUNTER — Other Ambulatory Visit (HOSPITAL_COMMUNITY): Payer: Self-pay | Admitting: Internal Medicine

## 2015-12-01 DIAGNOSIS — K529 Noninfective gastroenteritis and colitis, unspecified: Secondary | ICD-10-CM

## 2015-12-01 DIAGNOSIS — M619 Calcification and ossification of muscle, unspecified: Secondary | ICD-10-CM | POA: Diagnosis not present

## 2016-02-07 ENCOUNTER — Ambulatory Visit: Payer: Self-pay | Admitting: Gastroenterology

## 2016-06-17 ENCOUNTER — Emergency Department (HOSPITAL_COMMUNITY): Payer: Medicaid Other

## 2016-06-17 ENCOUNTER — Observation Stay (HOSPITAL_COMMUNITY)
Admission: EM | Admit: 2016-06-17 | Discharge: 2016-06-18 | Disposition: A | Discharge status: Home / Self Care | Payer: Medicaid Other | Attending: Internal Medicine | Admitting: Internal Medicine

## 2016-06-17 DIAGNOSIS — Z79899 Other long term (current) drug therapy: Secondary | ICD-10-CM | POA: Diagnosis not present

## 2016-06-17 DIAGNOSIS — E118 Type 2 diabetes mellitus with unspecified complications: Secondary | ICD-10-CM | POA: Diagnosis not present

## 2016-06-17 DIAGNOSIS — E119 Type 2 diabetes mellitus without complications: Secondary | ICD-10-CM

## 2016-06-17 DIAGNOSIS — R0602 Shortness of breath: Secondary | ICD-10-CM | POA: Diagnosis present

## 2016-06-17 DIAGNOSIS — Z7982 Long term (current) use of aspirin: Secondary | ICD-10-CM | POA: Diagnosis not present

## 2016-06-17 DIAGNOSIS — I160 Hypertensive urgency: Principal | ICD-10-CM | POA: Diagnosis present

## 2016-06-17 DIAGNOSIS — E785 Hyperlipidemia, unspecified: Secondary | ICD-10-CM

## 2016-06-17 DIAGNOSIS — I10 Essential (primary) hypertension: Secondary | ICD-10-CM | POA: Diagnosis not present

## 2016-06-17 DIAGNOSIS — M545 Low back pain, unspecified: Secondary | ICD-10-CM

## 2016-06-17 LAB — BASIC METABOLIC PANEL
Anion gap: 10 (ref 5–15)
BUN: 14 mg/dL (ref 6–20)
CO2: 28 mmol/L (ref 22–32)
Calcium: 8.9 mg/dL (ref 8.9–10.3)
Chloride: 101 mmol/L (ref 101–111)
Creatinine, Ser: 0.92 mg/dL (ref 0.61–1.24)
GFR calc Af Amer: >60 mL/min (ref >60)
GFR calc non Af Amer: >60 mL/min (ref >60)
Glucose, Bld: 226 mg/dL — ABNORMAL HIGH (ref 65–99)
Potassium: 3.9 mmol/L (ref 3.5–5.1)
Sodium: 139 mmol/L (ref 135–145)

## 2016-06-17 LAB — URINALYSIS, ROUTINE W REFLEX MICROSCOPIC
Bilirubin Urine: NEGATIVE
Glucose, UA: 150 mg/dL — AB
Hgb urine dipstick: NEGATIVE
Ketones, ur: NEGATIVE mg/dL
Leukocytes, UA: NEGATIVE
Nitrite: NEGATIVE
Protein, ur: NEGATIVE mg/dL
Specific Gravity, Urine: 1.011 (ref 1.005–1.030)
pH: 7 (ref 5.0–8.0)

## 2016-06-17 LAB — RAPID URINE DRUG SCREEN, HOSP PERFORMED
Amphetamines: NOT DETECTED
Barbiturates: NOT DETECTED
Benzodiazepines: NOT DETECTED
Cocaine: NOT DETECTED
Opiates: NOT DETECTED
Tetrahydrocannabinol: NOT DETECTED

## 2016-06-17 LAB — D-DIMER, QUANTITATIVE: D-Dimer, Quant: 0.39 ug/mL-FEU (ref 0.00–0.50)

## 2016-06-17 LAB — CBC
HCT: 42.1 % (ref 39.0–52.0)
Hemoglobin: 13.6 g/dL (ref 13.0–17.0)
MCH: 27.5 pg (ref 26.0–34.0)
MCHC: 32.3 g/dL (ref 30.0–36.0)
MCV: 85.2 fL (ref 78.0–100.0)
Platelets: 339 10*3/uL (ref 150–400)
RBC: 4.94 MIL/uL (ref 4.22–5.81)
RDW: 15.4 % (ref 11.5–15.5)
WBC: 11.7 10*3/uL — ABNORMAL HIGH (ref 4.0–10.5)

## 2016-06-17 LAB — I-STAT TROPONIN, ED: Troponin i, poc: 0 ng/mL (ref 0.00–0.08)

## 2016-06-17 LAB — GLUCOSE, CAPILLARY: Glucose-Capillary: 128 mg/dL — ABNORMAL HIGH (ref 65–99)

## 2016-06-17 LAB — BRAIN NATRIURETIC PEPTIDE: B Natriuretic Peptide: 90.1 pg/mL (ref 0.0–100.0)

## 2016-06-17 LAB — MRSA PCR SCREENING: MRSA by PCR: NEGATIVE

## 2016-06-17 MED ORDER — SODIUM CHLORIDE 0.9 % IV SOLN: 250.0000 mL | INTRAVENOUS | Status: DC | PRN

## 2016-06-17 MED ORDER — DOCUSATE SODIUM 100 MG PO CAPS: 100.0000 mg | ORAL_CAPSULE | Freq: Two times a day (BID) | ORAL | Status: DC | PRN

## 2016-06-17 MED ORDER — NICARDIPINE HCL IN NACL 20-0.86 MG/200ML-% IV SOLN: 3.0000 mg/h | Freq: Once | INTRAVENOUS | Status: DC

## 2016-06-17 MED ORDER — KETOROLAC TROMETHAMINE 30 MG/ML IJ SOLN
30.0000 mg | Freq: Once | INTRAMUSCULAR | Status: AC
  Administered 2016-06-17: 30 mg via INTRAVENOUS
  Filled 2016-06-17: qty 1

## 2016-06-17 MED ORDER — SODIUM CHLORIDE 0.9% FLUSH
3.0000 mL | Freq: Two times a day (BID) | INTRAVENOUS | Status: DC
  Administered 2016-06-18: 3 mL via INTRAVENOUS

## 2016-06-17 MED ORDER — LABETALOL HCL 5 MG/ML IV SOLN
10.0000 mg | Freq: Once | INTRAVENOUS | Status: AC
  Administered 2016-06-17: 10 mg via INTRAVENOUS
  Filled 2016-06-17: qty 4

## 2016-06-17 MED ORDER — ALPRAZOLAM 0.5 MG PO TABS: 2.0000 mg | ORAL_TABLET | Freq: Every evening | ORAL | Status: DC | PRN

## 2016-06-17 MED ORDER — SODIUM CHLORIDE 0.9% FLUSH: 3.0000 mL | INTRAVENOUS | Status: DC | PRN

## 2016-06-17 MED ORDER — ATORVASTATIN CALCIUM 40 MG PO TABS
40.0000 mg | ORAL_TABLET | Freq: Every day | ORAL | Status: DC
  Administered 2016-06-17: 40 mg via ORAL
  Filled 2016-06-17: qty 1

## 2016-06-17 MED ORDER — ENOXAPARIN SODIUM 40 MG/0.4ML ~~LOC~~ SOLN
40.0000 mg | SUBCUTANEOUS | Status: DC
  Administered 2016-06-17: 40 mg via SUBCUTANEOUS
  Filled 2016-06-17: qty 0.4

## 2016-06-17 MED ORDER — NITROGLYCERIN 2 % TD OINT
1.0000 [in_us] | TOPICAL_OINTMENT | Freq: Once | TRANSDERMAL | Status: AC
  Administered 2016-06-17: 1 [in_us] via TOPICAL
  Filled 2016-06-17: qty 1

## 2016-06-17 MED ORDER — AMLODIPINE BESYLATE 5 MG PO TABS
10.0000 mg | ORAL_TABLET | Freq: Once | ORAL | Status: AC
  Administered 2016-06-17: 10 mg via ORAL
  Filled 2016-06-17: qty 2

## 2016-06-17 MED ORDER — SILVER SULFADIAZINE 1 % EX CREA
TOPICAL_CREAM | Freq: Once | CUTANEOUS | Status: AC
  Administered 2016-06-17: 1 via TOPICAL
  Filled 2016-06-17: qty 85

## 2016-06-17 MED ORDER — OXYCODONE-ACETAMINOPHEN 5-325 MG PO TABS
1.0000 | ORAL_TABLET | Freq: Once | ORAL | Status: AC
  Administered 2016-06-17: 1 via ORAL
  Filled 2016-06-17: qty 1

## 2016-06-17 MED ORDER — HYDROCODONE-ACETAMINOPHEN 5-325 MG PO TABS
1.0000 | ORAL_TABLET | Freq: Four times a day (QID) | ORAL | Status: DC | PRN
  Administered 2016-06-17: 1 via ORAL
  Filled 2016-06-17: qty 1

## 2016-06-17 MED ORDER — SODIUM CHLORIDE 0.9% FLUSH
3.0000 mL | Freq: Two times a day (BID) | INTRAVENOUS | Status: DC
  Administered 2016-06-17 – 2016-06-18 (×2): 3 mL via INTRAVENOUS

## 2016-06-17 MED ORDER — ASPIRIN EC 81 MG PO TBEC
81.0000 mg | DELAYED_RELEASE_TABLET | Freq: Every day | ORAL | Status: DC
  Administered 2016-06-17: 81 mg via ORAL
  Filled 2016-06-17: qty 1

## 2016-06-17 MED ORDER — AMLODIPINE BESYLATE 10 MG PO TABS
10.0000 mg | ORAL_TABLET | Freq: Every day | ORAL | Status: DC
  Administered 2016-06-17: 10 mg via ORAL
  Filled 2016-06-17: qty 1

## 2016-06-17 MED ORDER — HYDRALAZINE HCL 20 MG/ML IJ SOLN: 20.0000 mg | INTRAMUSCULAR | Status: DC | PRN

## 2016-06-17 MED ORDER — LACTATED RINGERS IV BOLUS (SEPSIS)
1000.0000 mL | Freq: Once | INTRAVENOUS | Status: AC
  Administered 2016-06-17: 1000 mL via INTRAVENOUS

## 2016-06-17 MED ORDER — ONDANSETRON 4 MG PO TBDP
4.0000 mg | ORAL_TABLET | Freq: Three times a day (TID) | ORAL | Status: DC | PRN
  Filled 2016-06-17: qty 1

## 2016-06-17 MED ORDER — NAPROXEN 250 MG PO TABS: 500.0000 mg | ORAL_TABLET | Freq: Two times a day (BID) | ORAL | Status: DC

## 2016-06-17 MED ORDER — ENOXAPARIN SODIUM 80 MG/0.8ML ~~LOC~~ SOLN: 80.0000 mg | SUBCUTANEOUS | Status: DC

## 2016-06-17 MED ORDER — LISINOPRIL 40 MG PO TABS
40.0000 mg | ORAL_TABLET | Freq: Every day | ORAL | Status: DC
  Administered 2016-06-18: 40 mg via ORAL
  Filled 2016-06-17: qty 1

## 2016-06-17 MED ORDER — PIOGLITAZONE HCL 30 MG PO TABS
30.0000 mg | ORAL_TABLET | Freq: Every day | ORAL | Status: DC
  Administered 2016-06-17: 30 mg via ORAL
  Filled 2016-06-17 (×2): qty 1

## 2016-06-17 MED ORDER — POLYETHYLENE GLYCOL 3350 17 G PO PACK
17.0000 g | PACK | Freq: Two times a day (BID) | ORAL | Status: DC
  Filled 2016-06-17 (×2): qty 1

## 2016-06-17 MED ORDER — INSULIN ASPART 100 UNIT/ML ~~LOC~~ SOLN: 0.0000 [IU] | Freq: Every day | SUBCUTANEOUS | Status: DC

## 2016-06-17 MED ORDER — HYDRALAZINE HCL 20 MG/ML IJ SOLN
20.0000 mg | Freq: Once | INTRAMUSCULAR | Status: AC
  Administered 2016-06-17: 20 mg via INTRAVENOUS
  Filled 2016-06-17: qty 1

## 2016-06-17 MED ORDER — INSULIN ASPART 100 UNIT/ML ~~LOC~~ SOLN
0.0000 [IU] | Freq: Three times a day (TID) | SUBCUTANEOUS | Status: DC
  Administered 2016-06-18: 2 [IU] via SUBCUTANEOUS
  Administered 2016-06-18: 5 [IU] via SUBCUTANEOUS

## 2016-06-17 MED ORDER — LABETALOL HCL 5 MG/ML IV SOLN
20.0000 mg | Freq: Once | INTRAVENOUS | Status: AC
  Administered 2016-06-17: 20 mg via INTRAVENOUS
  Filled 2016-06-17: qty 4

## 2016-06-17 NOTE — ED Notes (Signed)
Pt. Has small amount of petechiae to left upper arm where BP cuff was positioned. BP repositioned to forearm area.
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
## 2016-06-17 NOTE — ED Notes (Signed)
Pt. States he feels funny ever since he was given nitro paste. Paste removed at patient request at this time.

## 2016-06-17 NOTE — ED Notes (Signed)
Attempted to call report

## 2016-06-17 NOTE — ED Triage Notes (Signed)
Pt from home with c/o left flank pain x several days and a feeling of throat swelling with SOB x several days.  Pt denies any known allergies.  Pt reports urinary frequency.  NAD, speaking in full sentences, A&O, steady gait when ambulating.

## 2016-06-17 NOTE — ED Notes (Signed)
Patient transported to X-ray 

## 2016-06-17 NOTE — ED Notes (Signed)
Pt returned from xray
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
## 2016-06-18 ENCOUNTER — Observation Stay (HOSPITAL_BASED_OUTPATIENT_CLINIC_OR_DEPARTMENT_OTHER): Payer: Medicaid Other

## 2016-06-18 DIAGNOSIS — R0600 Dyspnea, unspecified: Secondary | ICD-10-CM

## 2016-06-18 DIAGNOSIS — I160 Hypertensive urgency: Secondary | ICD-10-CM | POA: Diagnosis not present

## 2016-06-18 DIAGNOSIS — E118 Type 2 diabetes mellitus with unspecified complications: Secondary | ICD-10-CM | POA: Diagnosis not present

## 2016-06-18 DIAGNOSIS — R9431 Abnormal electrocardiogram [ECG] [EKG]: Secondary | ICD-10-CM | POA: Diagnosis not present

## 2016-06-18 DIAGNOSIS — E785 Hyperlipidemia, unspecified: Secondary | ICD-10-CM | POA: Diagnosis not present

## 2016-06-18 DIAGNOSIS — M545 Low back pain: Secondary | ICD-10-CM | POA: Diagnosis not present

## 2016-06-18 LAB — ECHOCARDIOGRAM COMPLETE
E decel time: 190 msec
FS: 25 % — AB (ref 28–44)
Height: 72 in
IVS/LV PW RATIO, ED: 1
LA ID, A-P, ES: 37 mm
LA diam end sys: 37 mm
LA diam index: 1.26 cm/m2
LA vol A4C: 68.8 ml
LA vol index: 22.1 mL/m2
LA vol: 64.9 mL
LV PW d: 13 mm — AB (ref 0.6–1.1)
LV e' LATERAL: 9.57 cm/s
LVOT VTI: 23.5 cm
LVOT peak grad rest: 4 mmHg
LVOT peak vel: 96.6 cm/s
Lateral S' vel: 14.8 cm/s
MV Dec: 190
MV pk E vel: 3.3 m/s
RV sys press: 26 mmHg
Reg peak vel: 242 cm/s
TAPSE: 24.4 mm
TDI e' lateral: 9.57
TDI e' medial: 5.66
TR max vel: 242 cm/s
Weight: 5657.6 oz

## 2016-06-18 LAB — BASIC METABOLIC PANEL
Anion gap: 12 (ref 5–15)
BUN: 9 mg/dL (ref 6–20)
CO2: 25 mmol/L (ref 22–32)
Calcium: 8.7 mg/dL — ABNORMAL LOW (ref 8.9–10.3)
Chloride: 100 mmol/L — ABNORMAL LOW (ref 101–111)
Creatinine, Ser: 0.84 mg/dL (ref 0.61–1.24)
GFR calc Af Amer: >60 mL/min (ref >60)
GFR calc non Af Amer: >60 mL/min (ref >60)
Glucose, Bld: 150 mg/dL — ABNORMAL HIGH (ref 65–99)
Potassium: 3.4 mmol/L — ABNORMAL LOW (ref 3.5–5.1)
Sodium: 137 mmol/L (ref 135–145)

## 2016-06-18 LAB — GLUCOSE, CAPILLARY
Glucose-Capillary: 202 mg/dL — ABNORMAL HIGH (ref 65–99)
Glucose-Capillary: 144 mg/dL — ABNORMAL HIGH (ref 65–99)

## 2016-06-18 LAB — CBC
HCT: 40.5 % (ref 39.0–52.0)
Hemoglobin: 13 g/dL (ref 13.0–17.0)
MCH: 27 pg (ref 26.0–34.0)
MCHC: 32.1 g/dL (ref 30.0–36.0)
MCV: 84.2 fL (ref 78.0–100.0)
Platelets: 320 10*3/uL (ref 150–400)
RBC: 4.81 MIL/uL (ref 4.22–5.81)
RDW: 15.3 % (ref 11.5–15.5)
WBC: 13.9 10*3/uL — ABNORMAL HIGH (ref 4.0–10.5)

## 2016-06-18 LAB — HIV ANTIBODY (ROUTINE TESTING W REFLEX): HIV Screen 4th Generation wRfx: NONREACTIVE

## 2016-06-18 MED ORDER — PERFLUTREN LIPID MICROSPHERE
1.0000 mL | INTRAVENOUS | Status: AC | PRN
  Administered 2016-06-18: 4 mL via INTRAVENOUS
  Filled 2016-06-18: qty 10

## 2016-06-18 MED ORDER — POTASSIUM CHLORIDE CRYS ER 20 MEQ PO TBCR
40.0000 meq | EXTENDED_RELEASE_TABLET | Freq: Once | ORAL | Status: AC
  Administered 2016-06-18: 40 meq via ORAL
  Filled 2016-06-18: qty 2

## 2016-06-18 NOTE — Plan of Care (Signed)
Problem: Health Behavior/Discharge Planning: Goal: Ability to manage health-related needs will improve Outcome: Progressing Patient states he stopped taking BP medications because of the side effect. Education provided. Verbalized understanding.

## 2016-06-18 NOTE — Discharge Summary (Signed)
Physician Discharge Summary  Baldwin Kristiansen KVQ:[id number] DOB: [birth year] DOA: 06/17/2016  PCP: Dorrene German, MD  Admit date: 06/17/2016 Discharge date: 06/18/2016  Time spent: 45 minutes  Recommendations for Outpatient Follow-up:  Patient will be discharged to home.  Patient will need to follow up with primary care provider within one week of discharge.  Patient should continue medications as prescribed.  Patient should follow a heart healthy/carb modified diet.   Discharge Diagnoses:  Hypertensive Urgency Nonspecific T-wave inversions Dyspnea Diabetes mellitus, type II with hyperglycemia Chronic back pain Morbid obesity Hyperlipidemia  Discharge Condition: Stable  Diet recommendation: heart healthy/carb modified  Filed Weights   06/17/16 1141 06/17/16 2034 06/18/16 0411  Weight: (!) 166.5 kg (367 lb) (!) 162.4 kg (358 lb) (!) 160.4 kg (353 lb 9.6 oz)    History of present illness:  Gary Franco is a 51 y.o. male with a medical history of hypertension, diabetes mellitus, type II, chronic back pain, who presented to the emergency department with complaints of right-sided back pain. Back pain started today and has been constant, throbbing in character, and feels it is a 8 out of 10. Patient denies any recent trauma or injury, pushing or pulling of heavy items. Patient has had multiple ER visits for similar back pain. Patient also complains of increased urinary frequency without pain. He's also endorsed having 2 episodes of vomiting earlier today. He also endorses feeling short of breath, particularly with exertion. He currently denies any chest pain, abdominal pain, changes in bowel habits, recent illness or travel. Of note, patient does state he stopped taking his blood pressure medications and since that time his blood pressure has been very elevated. Reasons for stopping his medications were erectile dysfunction, possible side effects of angioedema that he heard about on TV as well as  reading on the Internet.  Hospital Course:  Hypertensive Urgency -Improved -Presented with blood pressure 229/93 -Likely secondary to uncontrolled hypertension due to discontinuation of antihypertensive medications and untreated sleep apnea -Discussed compliance with patient and wife at bedside -Patient has received amlodipine 10 mg, labetalol 30 mg IV, nitroglycerin paste, with no improvement in his blood pressure. -Placed on IV hydralazine PRN, however, was not needed as patient's BP responded well to his home meds.  -Continue amlodipine, lisinopril  Nonspecific T-wave inversions -Noted on EKG, and inferior leads -Possibly secondary to hypertensive urgency -Patient denies chest pain -Troponin unremarkable -Echocardiogram: EF 55-60%, Grade 2 diastolic dysfunction, PA peak pressure -Cardiology consulted and appreciated, given that patient has a normal echo, obesity, and no symptoms, stress not warranted at this time.   Dyspnea -Possibly secondary to morbid obesity versus hypertensive urgency. -Chest x-ray unremarkable for infection -BNP 90 -D-dimer 0.39, therefore unlikely PE. -O2 saturations on room air 98% -May also have some dyspnea secondary to uncontrolled sleep apnea  Diabetes mellitus, type II with hyperglycemia -Blood sugar 324 -Hold Trulicity- continue at discharge -Continue Actos (caution with CHF, however patient has a diastolic dysfunction, currently not overloaded) -Discussed metformin as an option with patient as it would also help weight loss, however, patient used in the past and had GI side effects -patient will need to discuss DM management with PCP -Hemoglobin A1c pending  Chronic back pain -Patient presented with right flank pain as well as right upper back pain- improving with no intervention -This does appear to be an ongoing issue, as patient has had multiple ER visits for similar problem in the last several years -Continue pain control -UA  was unremarkable, unlikely  to be urinary tract infection or pyelonephritis  Morbid obesity -BMP 49.9 -Patient states he has lost approximately 75-100 pounds. Congratulated him. -Will need to continue lifestyle modifications and exercise program.  Obstructive sleep apnea -Does not use CPAP -Should follow up with PCP  Hyperlipidemia -Continue statin  Procedures: Echocardiogram  Consultations: Cardiology  Discharge Exam: Vitals:   06/18/16 0411 06/18/16 1239  BP: (!) 145/61 (!) 149/74  Pulse: 66 66  Resp:  17  Temp: 98.1 F (36.7 C) 98.4 F (36.9 C)   Patient feels his back pain has improved, and states it is mild. Denies chest pain, shortness of breath, abdominal pain, N/V/D/C.    General: Well developed, well nourished, NAD, appears stated age  HEENT: NCAT,  mucous membranes moist.   Neck: Supple, no JVD, no masses  Cardiovascular: S1 S2 auscultated, no rubs, murmurs or gallops. Regular rate and rhythm.  Respiratory: Clear to auscultation bilaterally with equal chest rise  Abdomen: Soft, Obese, nontender, nondistended, + bowel sounds, well-healed surgical scare  Extremities: warm dry without cyanosis clubbing. +Trace LE edema B/L   Neuro: AAOx3, nonfocal  Psych: Normal affect and demeanor with intact judgement and insight  Discharge Instructions Discharge Instructions    Discharge instructions    Complete by:  As directed    Patient will be discharged to home.  Patient will need to follow up with primary care provider within one week of discharge.  Patient should continue medications as prescribed. Patient should follow a heart healthy/carb modified diet.     Current Discharge Medication List    CONTINUE these medications which have NOT CHANGED   Details  alprazolam (XANAX) 2 MG tablet Take 2 mg by mouth at bedtime as needed for sleep.    ANDROGEL PUMP 20.25 MG/ACT (1.62%) GEL Apply 2 each topically daily. Refills: 0    aspirin EC 81 MG tablet Take  81 mg by mouth at bedtime.    Dulaglutide (TRULICITY) 1.5 MG/0.5ML SOPN Inject 1.5 mg into the skin every Saturday.     lisinopril (PRINIVIL,ZESTRIL) 40 MG tablet Take 40 mg by mouth daily.    oxycodone (ROXICODONE) 30 MG immediate release tablet Take 30 mg by mouth 3 (three) times daily. Refills: 0    pantoprazole (PROTONIX) 40 MG tablet Take 40 mg by mouth daily. Refills: 0    PROAIR HFA 108 (90 Base) MCG/ACT inhaler Inhale 2 puffs into the lungs daily as needed for shortness of breath. Refills: 0    amLODipine (NORVASC) 10 MG tablet Take 10 mg by mouth at bedtime.     atorvastatin (LIPITOR) 40 MG tablet Take 40 mg by mouth at bedtime.     pioglitazone (ACTOS) 30 MG tablet Take 30 mg by mouth at bedtime.     polyethylene glycol (MIRALAX) packet Take 17 g by mouth 2 (two) times daily. For constipation Qty: 14 each, Refills: 0      STOP taking these medications     metoprolol (LOPRESSOR) 100 MG tablet      docusate sodium (COLACE) 100 MG capsule      ondansetron (ZOFRAN ODT) 4 MG disintegrating tablet        No Known Allergies    The results of significant diagnostics from this hospitalization (including imaging, microbiology, ancillary and laboratory) are listed below for reference.    Significant Diagnostic Studies: Dg Chest 2 View  Result Date: 06/17/2016 CLINICAL DATA:  Right flank pain and shortness of breath. EXAM: CHEST  2 VIEW COMPARISON:  Chest radiograph 11/23/2015  FINDINGS: Stable cardiomegaly. No consolidative pulmonary opacities. No pleural effusion or pneumothorax. Old posttraumatic deformity involving the left lateral hemithorax. Multiple radiodensities project over the left chest wall and right upper quadrant. IMPRESSION: No acute cardiopulmonary process. Electronically Signed   By: Annia Belt M.D.   On: 06/17/2016 13:30    Microbiology: Recent Results (from the past 240 hour(s))  MRSA PCR Screening     Status: None   Collection Time: 06/17/16  8:35  PM  Result Value Ref Range Status   MRSA by PCR NEGATIVE NEGATIVE Final    Comment:        The GeneXpert MRSA Assay (FDA approved for NASAL specimens only), is one component of a comprehensive MRSA colonization surveillance program. It is not intended to diagnose MRSA infection nor to guide or monitor treatment for MRSA infections.      Labs: Basic Metabolic Panel:  Recent Labs Lab 06/17/16 1209 06/18/16 0435  NA 139 137  K 3.9 3.4*  CL 101 100*  CO2 28 25  GLUCOSE 226* 150*  BUN 14 9  CREATININE 0.92 0.84  CALCIUM 8.9 8.7*   Liver Function Tests: No results for input(s): AST, ALT, ALKPHOS, BILITOT, PROT, ALBUMIN in the last 168 hours. No results for input(s): LIPASE, AMYLASE in the last 168 hours. No results for input(s): AMMONIA in the last 168 hours. CBC:  Recent Labs Lab 06/17/16 1209 06/18/16 0435  WBC 11.7* 13.9*  HGB 13.6 13.0  HCT 42.1 40.5  MCV 85.2 84.2  PLT 339 320   Cardiac Enzymes: No results for input(s): CKTOTAL, CKMB, CKMBINDEX, TROPONINI in the last 168 hours. BNP: BNP (last 3 results)  Recent Labs  06/17/16 1209  BNP 90.1    ProBNP (last 3 results) No results for input(s): PROBNP in the last 8760 hours.  CBG:  Recent Labs Lab 06/17/16 2147 06/18/16 0747 06/18/16 1238  GLUCAP 128* 202* 144*       Signed:  Edsel Petrin  [facility] 06/18/2016, 2:36 PM
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
## 2016-06-18 NOTE — Progress Notes (Signed)
  Echocardiogram 2D Echocardiogram has been performed.  Gary Franco, Gary Franco 06/18/2016, 10:47 AM

## 2016-06-19 LAB — HEMOGLOBIN A1C
Hgb A1c MFr Bld: 8 % — ABNORMAL HIGH (ref 4.8–5.6)
Mean Plasma Glucose: 183 mg/dL

## 2017-01-25 ENCOUNTER — Emergency Department (HOSPITAL_COMMUNITY): Payer: Medicaid Other

## 2017-01-25 ENCOUNTER — Encounter (HOSPITAL_COMMUNITY): Payer: Self-pay

## 2017-01-25 ENCOUNTER — Emergency Department (HOSPITAL_COMMUNITY)
Admission: EM | Admit: 2017-01-25 | Discharge: 2017-01-25 | Disposition: A | Discharge status: Home / Self Care | Payer: Medicaid Other | Attending: Emergency Medicine | Admitting: Emergency Medicine

## 2017-01-25 DIAGNOSIS — R197 Diarrhea, unspecified: Secondary | ICD-10-CM | POA: Insufficient documentation

## 2017-01-25 DIAGNOSIS — Z79899 Other long term (current) drug therapy: Secondary | ICD-10-CM | POA: Diagnosis not present

## 2017-01-25 DIAGNOSIS — R112 Nausea with vomiting, unspecified: Secondary | ICD-10-CM

## 2017-01-25 DIAGNOSIS — Z7982 Long term (current) use of aspirin: Secondary | ICD-10-CM | POA: Diagnosis not present

## 2017-01-25 DIAGNOSIS — I10 Essential (primary) hypertension: Secondary | ICD-10-CM | POA: Diagnosis not present

## 2017-01-25 DIAGNOSIS — Z7984 Long term (current) use of oral hypoglycemic drugs: Secondary | ICD-10-CM | POA: Diagnosis not present

## 2017-01-25 DIAGNOSIS — R109 Unspecified abdominal pain: Secondary | ICD-10-CM

## 2017-01-25 DIAGNOSIS — E119 Type 2 diabetes mellitus without complications: Secondary | ICD-10-CM | POA: Diagnosis not present

## 2017-01-25 DIAGNOSIS — R1031 Right lower quadrant pain: Secondary | ICD-10-CM | POA: Insufficient documentation

## 2017-01-25 LAB — COMPREHENSIVE METABOLIC PANEL
ALT: 31 U/L (ref 17–63)
AST: 26 U/L (ref 15–41)
Albumin: 3.5 g/dL (ref 3.5–5.0)
Alkaline Phosphatase: 87 U/L (ref 38–126)
Anion gap: 9 (ref 5–15)
BUN: 14 mg/dL (ref 6–20)
CO2: 27 mmol/L (ref 22–32)
Calcium: 8.7 mg/dL — ABNORMAL LOW (ref 8.9–10.3)
Chloride: 104 mmol/L (ref 101–111)
Creatinine, Ser: 1.01 mg/dL (ref 0.61–1.24)
GFR calc Af Amer: >60 mL/min (ref >60)
GFR calc non Af Amer: >60 mL/min (ref >60)
Glucose, Bld: 174 mg/dL — ABNORMAL HIGH (ref 65–99)
Potassium: 3.6 mmol/L (ref 3.5–5.1)
Sodium: 140 mmol/L (ref 135–145)
Total Bilirubin: 0.6 mg/dL (ref 0.3–1.2)
Total Protein: 7.1 g/dL (ref 6.5–8.1)

## 2017-01-25 LAB — URINALYSIS, ROUTINE W REFLEX MICROSCOPIC
Bacteria, UA: NONE SEEN
Bilirubin Urine: NEGATIVE
Glucose, UA: NEGATIVE mg/dL
Hgb urine dipstick: NEGATIVE
Ketones, ur: NEGATIVE mg/dL
Nitrite: NEGATIVE
Protein, ur: 30 mg/dL — AB
Specific Gravity, Urine: 1.017 (ref 1.005–1.030)
pH: 6 (ref 5.0–8.0)

## 2017-01-25 LAB — CBC
HCT: 43.2 % (ref 39.0–52.0)
Hemoglobin: 13.8 g/dL (ref 13.0–17.0)
MCH: 27.2 pg (ref 26.0–34.0)
MCHC: 31.9 g/dL (ref 30.0–36.0)
MCV: 85 fL (ref 78.0–100.0)
Platelets: 340 10*3/uL (ref 150–400)
RBC: 5.08 MIL/uL (ref 4.22–5.81)
RDW: 15.9 % — ABNORMAL HIGH (ref 11.5–15.5)
WBC: 11.5 10*3/uL — ABNORMAL HIGH (ref 4.0–10.5)

## 2017-01-25 LAB — I-STAT TROPONIN, ED: Troponin i, poc: 0.02 ng/mL (ref 0.00–0.08)

## 2017-01-25 LAB — LIPASE, BLOOD: Lipase: 37 U/L (ref 11–51)

## 2017-01-25 MED ORDER — HYDROCODONE-ACETAMINOPHEN 5-325 MG PO TABS: 1.0000 | ORAL_TABLET | ORAL | 0 refills | Status: DC | PRN

## 2017-01-25 MED ORDER — ONDANSETRON HCL 4 MG PO TABS: 4.0000 mg | ORAL_TABLET | Freq: Three times a day (TID) | ORAL | 0 refills | Status: DC | PRN

## 2017-01-25 MED ORDER — ONDANSETRON HCL 4 MG/2ML IJ SOLN
4.0000 mg | Freq: Once | INTRAMUSCULAR | Status: AC
  Administered 2017-01-25: 4 mg via INTRAVENOUS
  Filled 2017-01-25: qty 2

## 2017-01-25 MED ORDER — SODIUM CHLORIDE 0.9 % IV BOLUS (SEPSIS)
1000.0000 mL | Freq: Once | INTRAVENOUS | Status: AC
  Administered 2017-01-25: 1000 mL via INTRAVENOUS

## 2017-01-25 MED ORDER — MORPHINE SULFATE (PF) 4 MG/ML IV SOLN
4.0000 mg | Freq: Once | INTRAVENOUS | Status: AC
  Administered 2017-01-25: 4 mg via INTRAVENOUS
  Filled 2017-01-25: qty 1

## 2017-01-25 NOTE — ED Notes (Signed)
Patient returned from xray.

## 2017-01-25 NOTE — ED Triage Notes (Signed)
Pt reports right flank pain associated with nausea, vomiting and diarrhea, onset yesterday. He reports pain "all over."  Pts BP at triage 211/93 and states he did not take his BP meds this morning.

## 2017-01-25 NOTE — ED Notes (Signed)
Patient transported to X-ray 

## 2017-01-25 NOTE — Discharge Instructions (Signed)
Your workup showed no evidence of kidney stone or other intra-abdominal abnormality.  I suspect you have a gastroenteritis causing the nausea, vomiting, diarrhea.  Please use the pain and nausea medicine to help control your symptoms and maintain hydration.  Please follow-up with your primary care physician in several days.  If any symptoms change or worsen, please return to the nearest emergency department.

## 2017-01-25 NOTE — ED Notes (Signed)
Pt reports the pain is causing him SOB. Denies CP
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
## 2017-01-26 LAB — URINE CULTURE: Culture: NO GROWTH

## 2017-03-21 ENCOUNTER — Ambulatory Visit
Admission: RE | Admit: 2017-03-21 | Discharge: 2017-03-21 | Disposition: A | Discharge status: Home / Self Care | Payer: Worker's Compensation | Source: Ambulatory Visit | Attending: Counselor | Admitting: Counselor

## 2017-03-21 ENCOUNTER — Other Ambulatory Visit: Payer: Self-pay | Admitting: Counselor

## 2017-03-21 ENCOUNTER — Ambulatory Visit
Admission: RE | Admit: 2017-03-21 | Discharge: 2017-03-21 | Disposition: A | Discharge status: Home / Self Care | Payer: Medicaid Other | Source: Ambulatory Visit | Attending: Counselor | Admitting: Counselor

## 2017-03-21 DIAGNOSIS — M8588 Other specified disorders of bone density and structure, other site: Secondary | ICD-10-CM | POA: Diagnosis not present

## 2017-03-21 DIAGNOSIS — S8992XA Unspecified injury of left lower leg, initial encounter: Secondary | ICD-10-CM | POA: Insufficient documentation

## 2017-03-21 DIAGNOSIS — T1490XA Injury, unspecified, initial encounter: Secondary | ICD-10-CM

## 2017-03-21 DIAGNOSIS — S8392XA Sprain of unspecified site of left knee, initial encounter: Secondary | ICD-10-CM | POA: Insufficient documentation

## 2017-03-21 DIAGNOSIS — T148XXA Other injury of unspecified body region, initial encounter: Secondary | ICD-10-CM

## 2017-12-18 ENCOUNTER — Ambulatory Visit
Admission: RE | Admit: 2017-12-18 | Discharge: 2017-12-18 | Disposition: A | Discharge status: Home / Self Care | Payer: Medicaid Other | Source: Ambulatory Visit | Attending: Internal Medicine | Admitting: Internal Medicine

## 2017-12-18 ENCOUNTER — Other Ambulatory Visit: Payer: Self-pay | Admitting: Internal Medicine

## 2017-12-18 DIAGNOSIS — M25562 Pain in left knee: Secondary | ICD-10-CM

## 2017-12-18 DIAGNOSIS — M25561 Pain in right knee: Secondary | ICD-10-CM

## 2018-01-29 ENCOUNTER — Telehealth: Payer: Self-pay | Admitting: Neurology

## 2018-01-29 ENCOUNTER — Encounter: Payer: Self-pay | Admitting: Neurology

## 2018-01-29 ENCOUNTER — Institutional Professional Consult (permissible substitution): Payer: Self-pay | Admitting: Neurology

## 2018-01-29 NOTE — Telephone Encounter (Signed)
Patient no showed for sleep consult apt today

## 2018-04-02 ENCOUNTER — Encounter: Payer: Self-pay | Admitting: Neurology

## 2018-04-02 ENCOUNTER — Ambulatory Visit: Payer: Medicaid Other | Admitting: Neurology

## 2018-04-02 VITALS — BP 161/91 | HR 75 | Ht 72.0 in | Wt 393.0 lb

## 2018-04-02 DIAGNOSIS — E662 Morbid (severe) obesity with alveolar hypoventilation: Secondary | ICD-10-CM | POA: Diagnosis not present

## 2018-04-02 DIAGNOSIS — F4310 Post-traumatic stress disorder, unspecified: Secondary | ICD-10-CM

## 2018-04-02 DIAGNOSIS — R0602 Shortness of breath: Secondary | ICD-10-CM

## 2018-04-02 DIAGNOSIS — I160 Hypertensive urgency: Secondary | ICD-10-CM

## 2018-04-02 DIAGNOSIS — W3400XS Accidental discharge from unspecified firearms or gun, sequela: Secondary | ICD-10-CM

## 2018-04-02 DIAGNOSIS — Z6841 Body Mass Index (BMI) 40.0 and over, adult: Secondary | ICD-10-CM

## 2018-04-02 DIAGNOSIS — E1142 Type 2 diabetes mellitus with diabetic polyneuropathy: Secondary | ICD-10-CM

## 2018-04-02 DIAGNOSIS — S21332S Puncture wound without foreign body of left front wall of thorax with penetration into thoracic cavity, sequela: Secondary | ICD-10-CM

## 2018-04-02 DIAGNOSIS — R0601 Orthopnea: Secondary | ICD-10-CM | POA: Diagnosis not present

## 2018-04-02 DIAGNOSIS — G4733 Obstructive sleep apnea (adult) (pediatric): Secondary | ICD-10-CM
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
## 2018-04-10 ENCOUNTER — Institutional Professional Consult (permissible substitution): Payer: Self-pay | Admitting: Neurology

## 2018-04-10 ENCOUNTER — Ambulatory Visit (INDEPENDENT_AMBULATORY_CARE_PROVIDER_SITE_OTHER): Payer: Medicaid Other | Admitting: Neurology

## 2018-04-10 DIAGNOSIS — Z6841 Body Mass Index (BMI) 40.0 and over, adult: Secondary | ICD-10-CM

## 2018-04-10 DIAGNOSIS — R0602 Shortness of breath: Secondary | ICD-10-CM

## 2018-04-10 DIAGNOSIS — G4733 Obstructive sleep apnea (adult) (pediatric): Secondary | ICD-10-CM

## 2018-04-10 DIAGNOSIS — E1142 Type 2 diabetes mellitus with diabetic polyneuropathy: Secondary | ICD-10-CM

## 2018-04-10 DIAGNOSIS — S21332S Puncture wound without foreign body of left front wall of thorax with penetration into thoracic cavity, sequela: Secondary | ICD-10-CM

## 2018-04-10 DIAGNOSIS — W3400XS Accidental discharge from unspecified firearms or gun, sequela: Secondary | ICD-10-CM

## 2018-04-10 DIAGNOSIS — F4310 Post-traumatic stress disorder, unspecified: Secondary | ICD-10-CM

## 2018-04-10 DIAGNOSIS — E662 Morbid (severe) obesity with alveolar hypoventilation: Secondary | ICD-10-CM

## 2018-04-10 DIAGNOSIS — I160 Hypertensive urgency: Secondary | ICD-10-CM

## 2018-04-10 DIAGNOSIS — R0601 Orthopnea: Secondary | ICD-10-CM

## 2018-04-21 ENCOUNTER — Telehealth: Payer: Self-pay | Admitting: Neurology

## 2018-04-21 DIAGNOSIS — R0601 Orthopnea: Secondary | ICD-10-CM | POA: Insufficient documentation

## 2018-04-21 DIAGNOSIS — Z6841 Body Mass Index (BMI) 40.0 and over, adult: Secondary | ICD-10-CM

## 2018-04-21 DIAGNOSIS — E662 Morbid (severe) obesity with alveolar hypoventilation: Secondary | ICD-10-CM | POA: Insufficient documentation

## 2018-04-21 DIAGNOSIS — F4310 Post-traumatic stress disorder, unspecified: Secondary | ICD-10-CM | POA: Insufficient documentation

## 2018-04-21 DIAGNOSIS — G4733 Obstructive sleep apnea (adult) (pediatric): Secondary | ICD-10-CM | POA: Insufficient documentation

## 2018-04-21 NOTE — Procedures (Signed)
PATIENT'S NAME:  Gary Franco, Gary Franco DOB:      Oct 04, 1965      MR#:    [id number]     DATE OF RECORDING: 04/10/2018 -Frances Nickels REFERRING M.D.:  Yates Decamp, MD Study Performed:   Baseline Polysomnogram HISTORY:  Mr. Gascoigne has struggled with morbid and now super-obesity for more than a decade now and he has developed associated comorbidities, excessive sleepiness and sleep apnea for which was already tested a little over 3 years ago at the [facility] sleep center.  He had undergone a CPAP titration on 20 February 2015 but he states that he could not use his CPAP for very long as he contracted frequent respiratory infections.  At the time he was still working early shifts and needed to be at work at 4 AM, the sleep study was cut short due to those hours.  Sleep AHI was 11.6/h.( which was low and surprised me).  During REM sleep his AHI was 39.7/h, oxygen nadir was 80% duration of hypoxemia was not noted.  The patient snored moderately loud, no central apneas were noted. The recommendations of Sleep hygiene and Shiftwork sleep disorder were not addressed in person with Dr. Maple Hudson.  The date for the CPAP titration was 25 May 2014  The patient endorsed the Epworth Sleepiness Scale at 18 points. FSS at 54/63 points (!).  The patient's weight 393 pounds with a height of 72 (inches), resulting in a BMI of 53.2 kg/m2. The patient's neck circumference measured 19 inches.  CURRENT MEDICATIONS: Xanax, Norvasc, Aspirin, Lipitor, Trulicity,Prinizide, Lopressor, Zofran, Roxicodone, Protonix, Adipex-P, Actos, ProAir   PROCEDURE:  This is a multichannel digital polysomnogram utilizing the Somnostar 11.2 system.  Electrodes and sensors were applied and monitored per AASM Specifications.   EEG, EOG, Chin and Limb EMG, were sampled at 200 Hz.  ECG, Snore and Nasal Pressure, Thermal Airflow, Respiratory Effort, CPAP Flow and Pressure, Oximetry was sampled at 50 Hz. Digital video and audio were recorded.      BASELINE STUDY: Lights Out  was at 00:04 and Lights On at 07:44.  Total recording time (TRT) was 459 minutes, with a total sleep time (TST) of 336 minutes.   The patient's sleep latency was 13.5 minutes.  REM latency was 407 minutes.  The sleep efficiency was 73.2 %.     SLEEP ARCHITECTURE: WASO (Wake after sleep onset) was 107.5 minutes.  There were 10 minutes in Stage N1, 251 minutes Stage N2, 52.5 minutes Stage N3 and 22.5 minutes in Stage REM.  The percentage of Stage N1 was 3.%, Stage N2 was 74.7%, Stage N3 was 15.6% and Stage R (REM sleep) was 6.7%.   RESPIRATORY ANALYSIS:  There were a total of 88 respiratory events:  12 obstructive apneas, and 1 mixed apnea with 75 hypopneas. The patient also had several respiratory event related arousals (RERAs).      The total APNEA/HYPOPNEA INDEX (AHI) was 15.7/hour and the total RESPIRATORY DISTURBANCE INDEX was 17.7 /hour.  24 events occurred in REM sleep and 108 events in NREM. The REM AHI was 64 /hour, versus a non-REM AHI of 12.2. The patient spent 308 minutes of total sleep time in the supine position and 28 minutes in non-supine. The supine AHI was 14.4 versus a non-supine AHI of 30.0.  OXYGEN SATURATION & C02:  The Wake baseline 02 saturation was 94%, with the lowest being 82%. Time spent below 89% saturation equaled 34 minutes.  AROUSALS: The arousals were noted as: 40 were spontaneous, 0 were associated  with PLMs, and 10 were associated with respiratory events.  The patient had a total of 0 Periodic Limb Movements.    Audio and video analysis did not show any abnormal or unusual movements, behaviors, phonations or vocalizations.  No nocturia. Snoring was noted. EKG was in keeping with normal sinus rhythm (NSR). Post-study, the patient indicated that sleep was the same as usual.    IMPRESSION:  1. Obstructive Sleep Apnea (OSA) at AHI of 15.7/h (mild to moderate) and strongly accentuated by REM sleep to a REM AHI of 64/h.  2. Nadir 82% SpO2 with 34 minutes total oxygen  desaturation time.  3. Normal sinus rhythm EKG prevailed.   RECOMMENDATIONS:  1. Advise full-night, attended, CPAP titration study to optimize therapy. The excessive daytime sleepiness and high fatigue can be attributed to OSA and borderline hypoxemia. Mr. Asare is alos at risk for obesity hypoventilation.    I certify that I have reviewed the entire raw data recording prior to the issuance of this report in accordance with the Standards of Accreditation of the American Academy of Sleep Medicine (AASM)     Melvyn Novas, MD  04-21-2018 Diplomat, American Board of Psychiatry and Neurology  Diplomat, American Board of Sleep Medicine Medical Director, [place] [facility] [employer]

## 2018-04-21 NOTE — Telephone Encounter (Signed)
-----   Message from Melvyn Novasarmen Dohmeier, MD sent at 04/21/2018  8:32 AM EST ----- IMPRESSION:  1. Obstructive Sleep Apnea (OSA) at AHI of 15.7/h (mild to  moderate) and strongly accentuated by REM sleep to a REM AHI of  64/h.  2. Nadir 82% SpO2 with 34 minutes total oxygen desaturation time.   3. Normal sinus rhythm EKG prevailed.   RECOMMENDATIONS:  1. Advise full-night, attended, CPAP titration study to optimize therapy. The excessive daytime sleepiness and high fatigue can be  attributed to OSA and borderline hypoxemia. Gary Franco is also at risk for obesity hypoventilation.

## 2018-04-21 NOTE — Telephone Encounter (Signed)
I called pt. I advised pt that Dr. Dohmeier reviewed their sleep study results and found that moderate to severe sleep apnea and recommends that pt be treated with a cpap. Dr. Dohmeier recommends that pt return for a repeat sleep study in order to properly titrate the cpap and ensure a good mask fit. Pt is agreeable to returning for a titration study. I advised pt that our sleep lab will file with pt's insurance and call pt to schedule the sleep study when we hear back from the pt's insurance regarding coverage of this sleep study. Pt verbalized understanding of results. Pt had no questions at this time but was encouraged to call back if questions arise.   

## 2018-04-21 NOTE — Addendum Note (Signed)
Addended by: Melvyn NovasHMEIER, Juel Bellerose on: 04/21/2018 08:32 AM   Modules accepted: Orders

## 2018-05-07 ENCOUNTER — Ambulatory Visit (INDEPENDENT_AMBULATORY_CARE_PROVIDER_SITE_OTHER): Payer: Medicaid Other | Admitting: Neurology

## 2018-05-07 DIAGNOSIS — S21332S Puncture wound without foreign body of left front wall of thorax with penetration into thoracic cavity, sequela: Secondary | ICD-10-CM

## 2018-05-07 DIAGNOSIS — F4310 Post-traumatic stress disorder, unspecified: Secondary | ICD-10-CM

## 2018-05-07 DIAGNOSIS — R0601 Orthopnea: Secondary | ICD-10-CM

## 2018-05-07 DIAGNOSIS — E662 Morbid (severe) obesity with alveolar hypoventilation: Secondary | ICD-10-CM

## 2018-05-07 DIAGNOSIS — G4733 Obstructive sleep apnea (adult) (pediatric): Secondary | ICD-10-CM | POA: Diagnosis not present

## 2018-05-07 DIAGNOSIS — W3400XS Accidental discharge from unspecified firearms or gun, sequela: Secondary | ICD-10-CM

## 2018-05-07 DIAGNOSIS — Z6841 Body Mass Index (BMI) 40.0 and over, adult: Secondary | ICD-10-CM

## 2018-05-07 DIAGNOSIS — E66813 Obesity, class 3: Secondary | ICD-10-CM

## 2018-05-09 ENCOUNTER — Encounter: Payer: Self-pay | Admitting: Cardiology

## 2018-05-09 ENCOUNTER — Ambulatory Visit (INDEPENDENT_AMBULATORY_CARE_PROVIDER_SITE_OTHER): Payer: Medicaid Other | Admitting: Cardiology

## 2018-05-09 VITALS — BP 139/82 | HR 74 | Wt 387.6 lb

## 2018-05-09 DIAGNOSIS — R9439 Abnormal result of other cardiovascular function study: Secondary | ICD-10-CM | POA: Diagnosis not present

## 2018-05-09 DIAGNOSIS — I10 Essential (primary) hypertension: Secondary | ICD-10-CM | POA: Diagnosis not present
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
## 2018-05-12 NOTE — Procedures (Signed)
PATIENT'S NAME:  Jaymes, Liebelt DOB:      December 10, 1965      MR#:    [id number]     DATE OF RECORDING: 05/07/2018  MR REFERRING M.D.:  Yates Decamp, Cardiology  Study Performed:   CPAP  Titration HISTORY:  Mr. Borseth has been referred by his cardiologist. He has struggled with morbid and now super-obesity for more than a decade now and he has developed associated comorbidities, excessive sleepiness and sleep apnea for which was already tested a little over 3 years ago at the [facility].  He had undergone a CPAP titration in 2016 but he could not use his CPAP for very long as he contracted frequent respiratory infections. His last Diagnostic Polysomnogram at [facility] performed on 04/10/2018 revealed: Obstructive Sleep Apnea (OSA) at AHI of 15.7/h (mild to moderate) and strongly accentuated by REM sleep to a REM AHI of 64/h. Nadir 82% SpO2 with 34 minutes total oxygen desaturation time. The patient endorsed the Epworth Sleepiness Scale at 18/24 points. FSS at 54/63 points.    The patient's weight 393 pounds with a height of 72 (inches), resulting in a BMI of 53.2 kg/m2.   CURRENT MEDICATIONS: Xanax, Norvasc, Aspirin, Lipitor, Trulicity, Prinzide, Lopressor, Zofran, Roxicodone, Protonix, Adipex-P, Actos, ProAir  PROCEDURE:  This is a multichannel digital polysomnogram utilizing the SomnoStar 11.2 system.  Electrodes and sensors were applied and monitored per AASM Specifications.   EEG, EOG, Chin and Limb EMG, were sampled at 200 Hz.  ECG, Snore and Nasal Pressure, Thermal Airflow, Respiratory Effort, CPAP Flow and Pressure, Oximetry was sampled at 50 Hz. Digital video and audio were recorded.      The patient was fitted with a Medium Vitera FFM mask in size medium. CPAP was initiated at 5 cmH20 with heated humidity per AASM split night standards and pressure was advanced to 13 cmH20 because of hypopneas, apneas and desaturations.  At a PAP pressure of 13 cmH20, there was a reduction of the AHI  to 0.9/h with improvement of sleep apnea.  Lights Out was at 20:59 and Lights On at 05:02. Total recording time (TRT) was 483 minutes, with a total sleep time (TST) of 377 minutes. The patient's sleep latency was 12.5 minutes. REM latency was 161.5 minutes.  The sleep efficiency was 78.1 %.    SLEEP ARCHITECTURE: WASO (Wake after sleep onset) was 94.5 minutes.  There were 13.5 minutes in Stage N1, 250.5 minutes Stage N2, 71.5 minutes Stage N3 and 41.5 minutes in Stage REM.  The percentage of Stage N1 was 3.6%, Stage N2 was 66.4%, and Stage N3 was 19.0%, and Stage R (REM sleep) was 11.0%. The sleep architecture was notable for delayed REM sleep onset.   RESPIRATORY ANALYSIS:  There was a total of 23 respiratory events: 2 obstructive apneas, 0 central apneas and 4 mixed apneas with 17 hypopneas. The patient also had few respiratory event related arousals (RERAs).      The total APNEA/HYPOPNEA INDEX (AHI) was 3.7 /hour .  11 events occurred in REM sleep and 12 events in NREM. The REM AHI was 15.9 /hour versus a non-REM AHI of 2.1 /hour. The patient spent 278.5 minutes of total sleep time in the supine position and 99 minutes in non-supine. The supine AHI was 5.0, versus a non-supine AHI of 0.0.  OXYGEN SATURATION & C02:  The baseline 02 saturation was 96%, with the lowest being 84%. Time spent below 89% saturation equaled 3 minutes.  PERIODIC LIMB MOVEMENTS: The  arousals were noted as: 23 were spontaneous, 0 were associated with PLMs, and 3 were associated with respiratory events. The patient had a total of 0 Periodic Limb Movements.   Audio and video analysis did not show any abnormal or unusual movements, behaviors, phonations or vocalizations. Nocturia times one. Some Snoring was noted even at 13 cm water pressure. EKG was in keeping with normal sinus rhythm. Post-study, the patient indicated that sleep was the same as usual.    DIAGNOSIS 1. Moderate Obstructive Sleep Apnea resolved under CPAP of  13 cm water,   2. Sleep Related Hypoxemia improved under CPAP.  3. Upper Airway Resistance Syndrome was noted, the patient snored even at final pressures of CPAP were reached.    PLANS/RECOMMENDATIONS: Autotitration capable CPAP 6-16 cm water with 3 cm EPR, heated humidity - and mask of patients choice (here a FFM was chosen by Mr. Brynda Greathouse, a Vitara medium ). 1. Achieve and maintain lean body weight, nasal patency and sleep hygiene.  2. CPAP therapy compliance is defined as 4 hours or more of nightly use.  3. The patient should avoid evening sedatives, hypnotics, and alcohol beverage consumption.   A follow up appointment will be scheduled in the Sleep Clinic at [facility].   Please call [phone] with any questions.      I certify that I have reviewed the entire raw data recording prior to the issuance of this report in accordance with the Standards of Accreditation of the American Academy of Sleep Medicine (AASM)   Melvyn Novas, M.D.    05-12-2018 Diplomat, American Board of Psychiatry and Neurology  Diplomat, American Board of Sleep Medicine Medical Director, [place] [facility]

## 2018-05-12 NOTE — Addendum Note (Signed)
Addended by: Melvyn Novas on: 05/12/2018 11:23 AM   Modules accepted: Orders

## 2018-05-14 ENCOUNTER — Telehealth: Payer: Self-pay | Admitting: Neurology

## 2018-05-14 NOTE — Telephone Encounter (Signed)
Called patient to discuss sleep study results. No answer at this time. LVM for the patient to call back.   

## 2018-05-14 NOTE — Telephone Encounter (Signed)
-----   Message from Melvyn Novas, MD sent at 05/12/2018 11:23 AM EST ----- DIAGNOSIS 1. Moderate Obstructive Sleep Apnea resolved under CPAP of 13 cm  water,  2. Sleep Related Hypoxemia improved under CPAP.  3. Upper Airway Resistance Syndrome was noted, the patient snored  even at final pressures of CPAP were reached.    PLANS/RECOMMENDATIONS: Autotitration capable CPAP 6-16 cm water  with 3 cm EPR, heated humidity - and mask of patients choice  (here a FFM was chosen by Mr. Brynda Greathouse, a Vitara medium ). 1. Achieve and maintain lean body weight, nasal patency and sleep  hygiene.  2. CPAP therapy compliance is defined as 4 hours or more of  nightly use.

## 2018-05-15 ENCOUNTER — Other Ambulatory Visit: Payer: Self-pay | Admitting: Cardiology

## 2018-05-15 NOTE — Telephone Encounter (Signed)
I called pt. I advised pt that Dr. Vickey Huger reviewed their sleep study results and found that pt has sleep apnea and was treated at 13 cm water pressure on CPAP. Dr. Vickey Huger recommends that pt starts auto CPAP 6-16 cm water pressure. I reviewed PAP compliance expectations with the pt. Pt is agreeable to starting a CPAP. I advised pt that an order will be sent to a DME, [facility], and [facility] will call the pt within about one week after they file with the pt's insurance. [facility] will show the pt how to use the machine, fit for masks, and troubleshoot the CPAP if needed. A follow up appt was made for insurance purposes with Dr. Vickey Huger on May 6,2020 at 8:30 am. Pt verbalized understanding to arrive 15 minutes early and bring their CPAP. A letter with all of this information in it will be mailed to the pt as a reminder. I verified with the pt that the address we have on file is correct. Pt verbalized understanding of results. Pt had no questions at this time but was encouraged to call back if questions arise. I have sent the order to [facility] and have received confirmation that they have received the order.

## 2018-07-23 ENCOUNTER — Telehealth: Payer: Self-pay | Admitting: Family Medicine

## 2018-07-23 NOTE — Telephone Encounter (Signed)
Due to current COVID 19 pandemic, our office is severely reducing in office visits until further notice, in order to minimize the risk to our patients and healthcare providers.   This patient was on Dr. Oliva Bustard schedule for 5/6. Per request, I called patient and offered a virtual visit with Amy NP instead. Patient agreed and was scheduled with Amy for 5/6 at 9:30 AM. Patient verbalized understanding of the doxy.me process, and is aware that he will receive a call from RN to update chart.   Pt understands that although there may be some limitations with this type of visit, we will take all precautions to reduce any security or privacy concerns.  Pt understands that this will be treated like an in office visit and we will file with pt's insurance, and there may be a patient responsible charge related to this service.
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
## 2018-07-30 ENCOUNTER — Ambulatory Visit (INDEPENDENT_AMBULATORY_CARE_PROVIDER_SITE_OTHER): Payer: Medicaid Other | Admitting: Family Medicine

## 2018-07-30 ENCOUNTER — Ambulatory Visit: Payer: Self-pay | Admitting: Neurology

## 2018-07-30 ENCOUNTER — Other Ambulatory Visit: Payer: Self-pay

## 2018-07-30 ENCOUNTER — Encounter: Payer: Self-pay | Admitting: Family Medicine

## 2018-07-30 DIAGNOSIS — G4733 Obstructive sleep apnea (adult) (pediatric): Secondary | ICD-10-CM

## 2018-08-13 ENCOUNTER — Telehealth: Payer: Self-pay

## 2018-08-13 NOTE — Telephone Encounter (Signed)
Follow up has been scheduled. Patient is aware of appt day and time.   

## 2018-11-12 ENCOUNTER — Other Ambulatory Visit: Payer: Self-pay

## 2018-11-12 ENCOUNTER — Ambulatory Visit: Payer: Medicaid Other | Admitting: Cardiology
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
## 2018-11-18 ENCOUNTER — Other Ambulatory Visit: Payer: Self-pay

## 2018-11-18 ENCOUNTER — Encounter: Payer: Self-pay | Admitting: Cardiology

## 2018-11-18 ENCOUNTER — Ambulatory Visit: Payer: Medicaid Other | Admitting: Cardiology

## 2018-11-18 VITALS — BP 130/70 | HR 66 | Temp 98.4°F | Ht 72.0 in | Wt 374.0 lb

## 2018-11-18 DIAGNOSIS — R9439 Abnormal result of other cardiovascular function study: Secondary | ICD-10-CM | POA: Insufficient documentation

## 2018-11-18 DIAGNOSIS — I10 Essential (primary) hypertension: Secondary | ICD-10-CM
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
## 2019-01-13 ENCOUNTER — Other Ambulatory Visit: Payer: Self-pay | Admitting: Cardiology

## 2019-05-22 ENCOUNTER — Ambulatory Visit: Payer: Medicaid Other | Admitting: Cardiology
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
## 2019-06-11 ENCOUNTER — Ambulatory Visit (INDEPENDENT_AMBULATORY_CARE_PROVIDER_SITE_OTHER): Payer: Medicaid Other | Admitting: Cardiology

## 2019-06-11 ENCOUNTER — Other Ambulatory Visit: Payer: Self-pay

## 2019-06-11 ENCOUNTER — Encounter: Payer: Self-pay | Admitting: Cardiology

## 2019-06-11 VITALS — BP 166/93 | HR 77 | Ht 72.0 in | Wt 378.0 lb

## 2019-06-11 DIAGNOSIS — I10 Essential (primary) hypertension: Secondary | ICD-10-CM

## 2019-06-11 DIAGNOSIS — R9439 Abnormal result of other cardiovascular function study: Secondary | ICD-10-CM

## 2019-07-04 ENCOUNTER — Ambulatory Visit: Payer: Medicaid Other | Attending: Internal Medicine

## 2019-07-04 DIAGNOSIS — Z23 Encounter for immunization: Secondary | ICD-10-CM

## 2019-07-04 NOTE — Progress Notes (Signed)
Covid-19 Vaccination Clinic  Name:  Gary Franco    MRN: [id number] DOB: 10-11-1965  07/04/2019  Gary Franco was observed post Covid-19 immunization for 15 minutes without incident. He was provided with Vaccine Information Sheet and instruction to access the V-Safe system.   Gary Franco was instructed to call 911 with any severe reactions post vaccine: Marland Kitchen Difficulty breathing  . Swelling of face and throat  . A fast heartbeat  . A bad rash all over body  . Dizziness and weakness   Immunizations Administered    Name Date Dose VIS Date Route   Pfizer COVID-19 Vaccine 07/04/2019  8:54 AM 0.3 mL 03/06/2019 Intramuscular   Manufacturer: [employer], [employer]   Lot: [id number]   NDC: [address]-8

## 2019-07-27 ENCOUNTER — Ambulatory Visit: Payer: Medicaid Other | Attending: Internal Medicine

## 2019-07-27 DIAGNOSIS — Z23 Encounter for immunization: Secondary | ICD-10-CM

## 2019-07-27 NOTE — Progress Notes (Signed)
Covid-19 Vaccination Clinic  Name:  Gary Franco    MRN: [id number] DOB: 1965-08-24  07/27/2019  Mr. [name] was observed post Covid-19 immunization for 15 minutes without incident. He was provided with Vaccine Information Sheet and instruction to access the V-Safe system.   Mr. [name] was instructed to call 911 with any severe reactions post vaccine: Marland Kitchen Difficulty breathing  . Swelling of face and throat  . A fast heartbeat  . A bad rash all over body  . Dizziness and weakness   Immunizations Administered    Name Date Dose VIS Date Route   Pfizer COVID-19 Vaccine 07/27/2019  9:42 AM 0.3 mL 05/20/2018 Intramuscular   Manufacturer: [employer], [employer]   Lot: [id number]   NDC: [address]-6

## 2019-07-28 ENCOUNTER — Ambulatory Visit: Payer: Medicaid Other

## 2019-07-29 ENCOUNTER — Telehealth: Payer: Self-pay
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
## 2019-07-29 NOTE — Telephone Encounter (Signed)
LVM about appt time

## 2019-07-30 ENCOUNTER — Ambulatory Visit: Payer: Self-pay | Admitting: Family Medicine

## 2019-07-30 ENCOUNTER — Encounter: Payer: Self-pay | Admitting: Family Medicine

## 2019-12-14 ENCOUNTER — Ambulatory Visit: Payer: Medicaid Other | Admitting: Cardiology

## 2019-12-14 ENCOUNTER — Other Ambulatory Visit: Payer: Self-pay

## 2019-12-14 ENCOUNTER — Encounter: Payer: Self-pay | Admitting: Cardiology

## 2019-12-14 VITALS — BP 128/81 | HR 73 | Resp 16 | Ht 72.0 in | Wt 367.0 lb

## 2019-12-14 DIAGNOSIS — I480 Paroxysmal atrial fibrillation: Secondary | ICD-10-CM | POA: Insufficient documentation

## 2019-12-14 DIAGNOSIS — I10 Essential (primary) hypertension: Secondary | ICD-10-CM

## 2019-12-14 MED ORDER — APIXABAN 5 MG PO TABS: 5.0000 mg | ORAL_TABLET | Freq: Two times a day (BID) | ORAL | 2 refills | Status: DC
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
## 2019-12-14 NOTE — Patient Instructions (Signed)

## 2020-01-07 ENCOUNTER — Other Ambulatory Visit: Payer: Self-pay | Admitting: Cardiology
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
## 2020-01-13 ENCOUNTER — Ambulatory Visit: Payer: Medicaid Other | Admitting: Cardiology

## 2020-01-13 ENCOUNTER — Encounter: Payer: Self-pay | Admitting: Cardiology

## 2020-01-13 ENCOUNTER — Other Ambulatory Visit: Payer: Self-pay

## 2020-01-13 VITALS — BP 164/79 | HR 59 | Resp 16 | Ht 72.0 in | Wt 370.0 lb

## 2020-01-13 DIAGNOSIS — R9439 Abnormal result of other cardiovascular function study: Secondary | ICD-10-CM

## 2020-01-13 DIAGNOSIS — I10 Essential (primary) hypertension: Secondary | ICD-10-CM

## 2020-01-13 DIAGNOSIS — I480 Paroxysmal atrial fibrillation: Secondary | ICD-10-CM

## 2020-01-13 MED ORDER — APIXABAN 5 MG PO TABS: 5.0000 mg | ORAL_TABLET | Freq: Two times a day (BID) | ORAL | 3 refills | Status: DC

## 2020-06-15 ENCOUNTER — Other Ambulatory Visit: Payer: Self-pay | Admitting: Internal Medicine

## 2020-06-17 ENCOUNTER — Other Ambulatory Visit: Payer: Self-pay | Admitting: Cardiology

## 2020-06-20 LAB — EXTRA LAV TOP TUBE

## 2020-06-20 LAB — TESTOSTERONE, FREE: TESTOSTERONE FREE: 10 pg/mL — ABNORMAL LOW (ref 46.0–224.0)

## 2020-07-13 ENCOUNTER — Other Ambulatory Visit: Payer: Self-pay

## 2020-07-13 ENCOUNTER — Encounter: Payer: Self-pay | Admitting: Cardiology

## 2020-07-13 ENCOUNTER — Ambulatory Visit: Payer: Medicaid Other | Admitting: Cardiology

## 2020-07-13 VITALS — BP 137/75 | HR 85 | Temp 98.1°F | Resp 17 | Ht 72.0 in | Wt 370.0 lb

## 2020-07-13 DIAGNOSIS — I10 Essential (primary) hypertension: Secondary | ICD-10-CM
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
## 2020-07-27 ENCOUNTER — Emergency Department (HOSPITAL_COMMUNITY): Payer: Medicaid Other

## 2020-07-27 ENCOUNTER — Encounter (HOSPITAL_COMMUNITY)
Admission: EM | Disposition: A | Discharge status: Home / Self Care | Payer: Self-pay | Source: Home / Self Care | Attending: Emergency Medicine

## 2020-07-27 ENCOUNTER — Observation Stay (HOSPITAL_COMMUNITY)
Admission: EM | Admit: 2020-07-27 | Discharge: 2020-07-29 | Disposition: A | Discharge status: Home / Self Care | Payer: Medicaid Other | Attending: Internal Medicine | Admitting: Internal Medicine

## 2020-07-27 ENCOUNTER — Other Ambulatory Visit: Payer: Self-pay

## 2020-07-27 ENCOUNTER — Encounter (HOSPITAL_COMMUNITY): Payer: Self-pay | Admitting: Emergency Medicine

## 2020-07-27 DIAGNOSIS — Z6841 Body Mass Index (BMI) 40.0 and over, adult: Secondary | ICD-10-CM | POA: Diagnosis not present

## 2020-07-27 DIAGNOSIS — Z7901 Long term (current) use of anticoagulants: Secondary | ICD-10-CM

## 2020-07-27 DIAGNOSIS — I480 Paroxysmal atrial fibrillation: Secondary | ICD-10-CM | POA: Diagnosis not present

## 2020-07-27 DIAGNOSIS — I161 Hypertensive emergency: Secondary | ICD-10-CM | POA: Insufficient documentation

## 2020-07-27 DIAGNOSIS — G4733 Obstructive sleep apnea (adult) (pediatric): Secondary | ICD-10-CM | POA: Diagnosis present

## 2020-07-27 DIAGNOSIS — Z9081 Acquired absence of spleen: Secondary | ICD-10-CM

## 2020-07-27 DIAGNOSIS — E119 Type 2 diabetes mellitus without complications: Secondary | ICD-10-CM | POA: Diagnosis not present

## 2020-07-27 DIAGNOSIS — I10 Essential (primary) hypertension: Secondary | ICD-10-CM | POA: Diagnosis present

## 2020-07-27 DIAGNOSIS — Z79899 Other long term (current) drug therapy: Secondary | ICD-10-CM

## 2020-07-27 DIAGNOSIS — Z20822 Contact with and (suspected) exposure to covid-19: Secondary | ICD-10-CM | POA: Diagnosis present

## 2020-07-27 DIAGNOSIS — Z8249 Family history of ischemic heart disease and other diseases of the circulatory system: Secondary | ICD-10-CM

## 2020-07-27 DIAGNOSIS — J189 Pneumonia, unspecified organism: Secondary | ICD-10-CM | POA: Diagnosis not present

## 2020-07-27 DIAGNOSIS — E785 Hyperlipidemia, unspecified: Secondary | ICD-10-CM | POA: Diagnosis present

## 2020-07-27 DIAGNOSIS — K219 Gastro-esophageal reflux disease without esophagitis: Secondary | ICD-10-CM | POA: Diagnosis present

## 2020-07-27 DIAGNOSIS — G8929 Other chronic pain: Secondary | ICD-10-CM | POA: Diagnosis not present

## 2020-07-27 DIAGNOSIS — R0602 Shortness of breath: Secondary | ICD-10-CM | POA: Diagnosis present

## 2020-07-27 DIAGNOSIS — R9389 Abnormal findings on diagnostic imaging of other specified body structures: Secondary | ICD-10-CM | POA: Diagnosis not present

## 2020-07-27 DIAGNOSIS — N529 Male erectile dysfunction, unspecified: Secondary | ICD-10-CM | POA: Diagnosis not present

## 2020-07-27 DIAGNOSIS — I248 Other forms of acute ischemic heart disease: Secondary | ICD-10-CM | POA: Diagnosis not present

## 2020-07-27 DIAGNOSIS — T467X1A Poisoning by peripheral vasodilators, accidental (unintentional), initial encounter: Secondary | ICD-10-CM | POA: Diagnosis not present

## 2020-07-27 DIAGNOSIS — R7989 Other specified abnormal findings of blood chemistry: Secondary | ICD-10-CM | POA: Diagnosis present

## 2020-07-27 DIAGNOSIS — Z833 Family history of diabetes mellitus: Secondary | ICD-10-CM

## 2020-07-27 DIAGNOSIS — Z794 Long term (current) use of insulin: Secondary | ICD-10-CM | POA: Diagnosis not present

## 2020-07-27 DIAGNOSIS — Z83438 Family history of other disorder of lipoprotein metabolism and other lipidemia: Secondary | ICD-10-CM | POA: Diagnosis not present

## 2020-07-27 DIAGNOSIS — R778 Other specified abnormalities of plasma proteins: Secondary | ICD-10-CM | POA: Diagnosis not present

## 2020-07-27 DIAGNOSIS — D72829 Elevated white blood cell count, unspecified: Secondary | ICD-10-CM

## 2020-07-27 DIAGNOSIS — J9601 Acute respiratory failure with hypoxia: Secondary | ICD-10-CM | POA: Diagnosis present

## 2020-07-27 LAB — BASIC METABOLIC PANEL
Anion gap: 9 (ref 5–15)
BUN: 14 mg/dL (ref 6–20)
CO2: 27 mmol/L (ref 22–32)
Calcium: 8.8 mg/dL — ABNORMAL LOW (ref 8.9–10.3)
Chloride: 102 mmol/L (ref 98–111)
Creatinine, Ser: 1.3 mg/dL — ABNORMAL HIGH (ref 0.61–1.24)
GFR, Estimated: >60 mL/min (ref >60)
Glucose, Bld: 223 mg/dL — ABNORMAL HIGH (ref 70–99)
Potassium: 3.7 mmol/L (ref 3.5–5.1)
Sodium: 138 mmol/L (ref 135–145)

## 2020-07-27 LAB — CBC
HCT: 42.4 % (ref 39.0–52.0)
Hemoglobin: 13 g/dL (ref 13.0–17.0)
MCH: 27 pg (ref 26.0–34.0)
MCHC: 30.7 g/dL (ref 30.0–36.0)
MCV: 88.1 fL (ref 80.0–100.0)
Platelets: 419 10*3/uL — ABNORMAL HIGH (ref 150–400)
RBC: 4.81 MIL/uL (ref 4.22–5.81)
RDW: 15.9 % — ABNORMAL HIGH (ref 11.5–15.5)
WBC: 18.5 10*3/uL — ABNORMAL HIGH (ref 4.0–10.5)
nRBC: 0 % (ref 0.0–0.2)

## 2020-07-27 LAB — RESP PANEL BY RT-PCR (FLU A&B, COVID) ARPGX2
Influenza A by PCR: NEGATIVE
Influenza B by PCR: NEGATIVE
SARS Coronavirus 2 by RT PCR: NEGATIVE

## 2020-07-27 LAB — RESPIRATORY PANEL BY PCR

## 2020-07-27 LAB — TROPONIN I (HIGH SENSITIVITY)
Troponin I (High Sensitivity): 19 ng/L — ABNORMAL HIGH (ref <18)
Troponin I (High Sensitivity): 22 ng/L — ABNORMAL HIGH (ref <18)
Troponin I (High Sensitivity): 29 ng/L — ABNORMAL HIGH (ref <18)

## 2020-07-27 LAB — GLUCOSE, CAPILLARY
Glucose-Capillary: 267 mg/dL — ABNORMAL HIGH (ref 70–99)
Glucose-Capillary: 237 mg/dL — ABNORMAL HIGH (ref 70–99)

## 2020-07-27 LAB — HIV ANTIBODY (ROUTINE TESTING W REFLEX): HIV Screen 4th Generation wRfx: NONREACTIVE

## 2020-07-27 LAB — BRAIN NATRIURETIC PEPTIDE: B Natriuretic Peptide: 46 pg/mL (ref 0.0–100.0)

## 2020-07-27 LAB — D-DIMER, QUANTITATIVE: D-Dimer, Quant: 0.64 ug/mL-FEU — ABNORMAL HIGH (ref 0.00–0.50)

## 2020-07-27 LAB — PROCALCITONIN: Procalcitonin: <0.1 ng/mL

## 2020-07-27 LAB — STREP PNEUMONIAE URINARY ANTIGEN: Strep Pneumo Urinary Antigen: NEGATIVE

## 2020-07-27 SURGERY — LEFT HEART CATH AND CORONARY ANGIOGRAPHY
Anesthesia: LOCAL

## 2020-07-27 MED ORDER — SODIUM CHLORIDE 0.9 % IV SOLN
1.0000 g | Freq: Once | INTRAVENOUS | Status: AC
  Administered 2020-07-27: 1 g via INTRAVENOUS
  Filled 2020-07-27: qty 10

## 2020-07-27 MED ORDER — ACETAMINOPHEN 650 MG RE SUPP: 650.0000 mg | Freq: Four times a day (QID) | RECTAL | Status: DC | PRN

## 2020-07-27 MED ORDER — PANTOPRAZOLE SODIUM 40 MG PO TBEC
40.0000 mg | DELAYED_RELEASE_TABLET | Freq: Two times a day (BID) | ORAL | Status: DC
  Administered 2020-07-27 – 2020-07-29 (×4): 40 mg via ORAL
  Filled 2020-07-27 (×4): qty 1

## 2020-07-27 MED ORDER — APIXABAN 5 MG PO TABS: 5.0000 mg | ORAL_TABLET | Freq: Two times a day (BID) | ORAL | Status: DC

## 2020-07-27 MED ORDER — HYDROCHLOROTHIAZIDE 12.5 MG PO CAPS
12.5000 mg | ORAL_CAPSULE | Freq: Every day | ORAL | Status: DC
  Administered 2020-07-27: 12.5 mg via ORAL
  Filled 2020-07-27: qty 1

## 2020-07-27 MED ORDER — SODIUM CHLORIDE 0.9% FLUSH
3.0000 mL | Freq: Two times a day (BID) | INTRAVENOUS | Status: DC
  Administered 2020-07-27 – 2020-07-28 (×4): 3 mL via INTRAVENOUS

## 2020-07-27 MED ORDER — DICLOFENAC SODIUM 1 % EX GEL
2.0000 g | Freq: Four times a day (QID) | CUTANEOUS | Status: DC | PRN
  Filled 2020-07-27: qty 100

## 2020-07-27 MED ORDER — IOHEXOL 350 MG/ML SOLN
100.0000 mL | Freq: Once | INTRAVENOUS | Status: AC | PRN
  Administered 2020-07-27: 100 mL via INTRAVENOUS

## 2020-07-27 MED ORDER — CLONIDINE HCL 0.2 MG PO TABS
0.2000 mg | ORAL_TABLET | Freq: Once | ORAL | Status: AC
  Administered 2020-07-27: 0.2 mg via ORAL
  Filled 2020-07-27: qty 1

## 2020-07-27 MED ORDER — ALPRAZOLAM 0.5 MG PO TABS
2.0000 mg | ORAL_TABLET | Freq: Every day | ORAL | Status: DC | PRN
  Administered 2020-07-27: 2 mg via ORAL
  Filled 2020-07-27: qty 4

## 2020-07-27 MED ORDER — HYDRALAZINE HCL 25 MG PO TABS
50.0000 mg | ORAL_TABLET | Freq: Once | ORAL | Status: AC
  Administered 2020-07-27: 50 mg via ORAL
  Filled 2020-07-27: qty 2

## 2020-07-27 MED ORDER — METOPROLOL TARTRATE 25 MG PO TABS
100.0000 mg | ORAL_TABLET | Freq: Once | ORAL | Status: AC
  Administered 2020-07-27: 100 mg via ORAL
  Filled 2020-07-27: qty 4

## 2020-07-27 MED ORDER — PANTOPRAZOLE SODIUM 40 MG PO TBEC: 40.0000 mg | DELAYED_RELEASE_TABLET | Freq: Two times a day (BID) | ORAL | Status: DC

## 2020-07-27 MED ORDER — LISINOPRIL 20 MG PO TABS
20.0000 mg | ORAL_TABLET | Freq: Once | ORAL | Status: AC
  Administered 2020-07-27: 20 mg via ORAL
  Filled 2020-07-27: qty 1

## 2020-07-27 MED ORDER — ATORVASTATIN CALCIUM 40 MG PO TABS
40.0000 mg | ORAL_TABLET | Freq: Every day | ORAL | Status: DC
  Administered 2020-07-27 – 2020-07-28 (×2): 40 mg via ORAL
  Filled 2020-07-27 (×2): qty 1

## 2020-07-27 MED ORDER — HYDROCHLOROTHIAZIDE 12.5 MG PO CAPS
12.5000 mg | ORAL_CAPSULE | Freq: Two times a day (BID) | ORAL | Status: DC
  Administered 2020-07-27 – 2020-07-29 (×4): 12.5 mg via ORAL
  Filled 2020-07-27 (×4): qty 1

## 2020-07-27 MED ORDER — ASPIRIN 81 MG PO CHEW
324.0000 mg | CHEWABLE_TABLET | Freq: Once | ORAL | Status: AC
  Administered 2020-07-27: 324 mg via ORAL
  Filled 2020-07-27: qty 4

## 2020-07-27 MED ORDER — LISINOPRIL-HYDROCHLOROTHIAZIDE 20-12.5 MG PO TABS: 1.0000 | ORAL_TABLET | Freq: Two times a day (BID) | ORAL | Status: DC

## 2020-07-27 MED ORDER — METOPROLOL TARTRATE 100 MG PO TABS
100.0000 mg | ORAL_TABLET | Freq: Two times a day (BID) | ORAL | Status: DC
  Administered 2020-07-27 – 2020-07-29 (×5): 100 mg via ORAL
  Filled 2020-07-27 (×5): qty 1

## 2020-07-27 MED ORDER — AZITHROMYCIN 250 MG PO TABS
500.0000 mg | ORAL_TABLET | Freq: Every day | ORAL | Status: DC
  Administered 2020-07-28 – 2020-07-29 (×2): 500 mg via ORAL
  Filled 2020-07-27 (×2): qty 2

## 2020-07-27 MED ORDER — LISINOPRIL 20 MG PO TABS
20.0000 mg | ORAL_TABLET | Freq: Two times a day (BID) | ORAL | Status: DC
  Administered 2020-07-27 – 2020-07-29 (×5): 20 mg via ORAL
  Filled 2020-07-27 (×5): qty 1

## 2020-07-27 MED ORDER — ASPIRIN EC 81 MG PO TBEC
81.0000 mg | DELAYED_RELEASE_TABLET | Freq: Every day | ORAL | Status: DC
  Administered 2020-07-28 – 2020-07-29 (×2): 81 mg via ORAL
  Filled 2020-07-27 (×2): qty 1

## 2020-07-27 MED ORDER — SODIUM CHLORIDE 0.9% FLUSH
3.0000 mL | Freq: Two times a day (BID) | INTRAVENOUS | Status: DC
  Administered 2020-07-27 – 2020-07-29 (×4): 3 mL via INTRAVENOUS

## 2020-07-27 MED ORDER — ACETAMINOPHEN 325 MG PO TABS: 650.0000 mg | ORAL_TABLET | Freq: Four times a day (QID) | ORAL | Status: DC | PRN

## 2020-07-27 MED ORDER — ALBUTEROL SULFATE (2.5 MG/3ML) 0.083% IN NEBU: 2.5000 mg | INHALATION_SOLUTION | Freq: Four times a day (QID) | RESPIRATORY_TRACT | Status: DC | PRN

## 2020-07-27 MED ORDER — HEPARIN (PORCINE) 25000 UT/250ML-% IV SOLN
2300.0000 [IU]/h | INTRAVENOUS | Status: DC
  Administered 2020-07-27: 1900 [IU]/h via INTRAVENOUS
  Filled 2020-07-27 (×2): qty 250

## 2020-07-27 MED ORDER — CLONIDINE HCL 0.2 MG PO TABS
0.2000 mg | ORAL_TABLET | Freq: Two times a day (BID) | ORAL | Status: DC
  Administered 2020-07-27 – 2020-07-29 (×5): 0.2 mg via ORAL
  Filled 2020-07-27 (×5): qty 1

## 2020-07-27 MED ORDER — IPRATROPIUM-ALBUTEROL 0.5-2.5 (3) MG/3ML IN SOLN
5.0000 mL | Freq: Once | RESPIRATORY_TRACT | Status: AC
  Administered 2020-07-27: 5 mL via RESPIRATORY_TRACT
  Filled 2020-07-27: qty 3

## 2020-07-27 MED ORDER — OXYCODONE HCL 5 MG PO TABS
20.0000 mg | ORAL_TABLET | Freq: Four times a day (QID) | ORAL | Status: DC | PRN
Start: 2020-07-27 — End: 2020-07-29
  Administered 2020-07-27 – 2020-07-28 (×2): 20 mg via ORAL
  Filled 2020-07-27 (×2): qty 4

## 2020-07-27 MED ORDER — AZITHROMYCIN 250 MG PO TABS
500.0000 mg | ORAL_TABLET | Freq: Once | ORAL | Status: AC
  Administered 2020-07-27: 500 mg via ORAL
  Filled 2020-07-27: qty 2

## 2020-07-27 MED ORDER — APIXABAN 5 MG PO TABS
5.0000 mg | ORAL_TABLET | Freq: Two times a day (BID) | ORAL | Status: DC
  Administered 2020-07-27: 5 mg via ORAL
  Filled 2020-07-27: qty 1

## 2020-07-27 MED ORDER — SODIUM CHLORIDE 0.9 % IV SOLN
1.0000 g | INTRAVENOUS | Status: DC
  Administered 2020-07-28 – 2020-07-29 (×2): 1 g via INTRAVENOUS
  Filled 2020-07-27 (×2): qty 10

## 2020-07-27 MED ORDER — INSULIN ASPART PROT & ASPART (70-30 MIX) 100 UNIT/ML ~~LOC~~ SUSP
10.0000 [IU] | Freq: Two times a day (BID) | SUBCUTANEOUS | Status: DC
  Administered 2020-07-27: 10 [IU] via SUBCUTANEOUS
  Filled 2020-07-27: qty 10

## 2020-07-27 NOTE — ED Notes (Signed)
O2 sat 89-91% while ambulating, came back up to 95% after the patient sat back down.
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
## 2020-07-27 NOTE — ED Notes (Signed)
Patient transported to CT 

## 2020-07-27 NOTE — Progress Notes (Signed)
RT Note-Nebulizer pending Covid results.
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
## 2020-07-27 NOTE — ED Notes (Signed)
Provided pt with sandwich and apple juice.

## 2020-07-27 NOTE — ED Notes (Signed)
Report called to floor RN on 2W

## 2020-07-28 ENCOUNTER — Observation Stay (HOSPITAL_COMMUNITY): Payer: Medicaid Other

## 2020-07-28 ENCOUNTER — Ambulatory Visit (HOSPITAL_COMMUNITY)
Admission: EM | Disposition: A | Discharge status: Home / Self Care | Payer: Self-pay | Source: Home / Self Care | Attending: Emergency Medicine

## 2020-07-28 DIAGNOSIS — R778 Other specified abnormalities of plasma proteins: Secondary | ICD-10-CM

## 2020-07-28 DIAGNOSIS — R0609 Other forms of dyspnea: Secondary | ICD-10-CM | POA: Diagnosis not present

## 2020-07-28 DIAGNOSIS — I10 Essential (primary) hypertension: Secondary | ICD-10-CM

## 2020-07-28 DIAGNOSIS — R0602 Shortness of breath: Secondary | ICD-10-CM

## 2020-07-28 DIAGNOSIS — I161 Hypertensive emergency: Secondary | ICD-10-CM | POA: Diagnosis not present

## 2020-07-28 DIAGNOSIS — I248 Other forms of acute ischemic heart disease: Secondary | ICD-10-CM | POA: Diagnosis not present

## 2020-07-28 DIAGNOSIS — J189 Pneumonia, unspecified organism: Secondary | ICD-10-CM | POA: Diagnosis not present

## 2020-07-28 DIAGNOSIS — J9601 Acute respiratory failure with hypoxia: Secondary | ICD-10-CM | POA: Diagnosis not present

## 2020-07-28 HISTORY — PX: LEFT HEART CATH AND CORONARY ANGIOGRAPHY: CATH118249

## 2020-07-28 HISTORY — PX: INTRAVASCULAR PRESSURE WIRE/FFR STUDY: CATH118243

## 2020-07-28 LAB — EXPECTORATED SPUTUM ASSESSMENT W GRAM STAIN, RFLX TO RESP C

## 2020-07-28 LAB — COMPREHENSIVE METABOLIC PANEL
ALT: 14 U/L (ref 0–44)
AST: 12 U/L — ABNORMAL LOW (ref 15–41)
Albumin: 2.6 g/dL — ABNORMAL LOW (ref 3.5–5.0)
Alkaline Phosphatase: 61 U/L (ref 38–126)
Anion gap: 5 (ref 5–15)
BUN: 11 mg/dL (ref 6–20)
CO2: 27 mmol/L (ref 22–32)
Calcium: 8.7 mg/dL — ABNORMAL LOW (ref 8.9–10.3)
Chloride: 102 mmol/L (ref 98–111)
Creatinine, Ser: 1.02 mg/dL (ref 0.61–1.24)
GFR, Estimated: >60 mL/min (ref >60)
Glucose, Bld: 222 mg/dL — ABNORMAL HIGH (ref 70–99)
Potassium: 3.8 mmol/L (ref 3.5–5.1)
Sodium: 134 mmol/L — ABNORMAL LOW (ref 135–145)
Total Bilirubin: 0.5 mg/dL (ref 0.3–1.2)
Total Protein: 5.9 g/dL — ABNORMAL LOW (ref 6.5–8.1)

## 2020-07-28 LAB — GLUCOSE, CAPILLARY
Glucose-Capillary: 223 mg/dL — ABNORMAL HIGH (ref 70–99)
Glucose-Capillary: 215 mg/dL — ABNORMAL HIGH (ref 70–99)
Glucose-Capillary: 181 mg/dL — ABNORMAL HIGH (ref 70–99)
Glucose-Capillary: 308 mg/dL — ABNORMAL HIGH (ref 70–99)

## 2020-07-28 LAB — LIPID PANEL
Cholesterol: 127 mg/dL (ref 0–200)
HDL: 29 mg/dL — ABNORMAL LOW (ref >40)
LDL Cholesterol: 90 mg/dL (ref 0–99)
Total CHOL/HDL Ratio: 4.4 RATIO
Triglycerides: 41 mg/dL (ref <150)
VLDL: 8 mg/dL (ref 0–40)

## 2020-07-28 LAB — ECHOCARDIOGRAM COMPLETE
AR max vel: 2.67 cm2
AV Area VTI: 2.59 cm2
AV Area mean vel: 2.46 cm2
AV Mean grad: 6 mmHg
AV Peak grad: 10 mmHg
Ao pk vel: 1.58 m/s
Area-P 1/2: 3.77 cm2
S' Lateral: 3.1 cm
Weight: 5915.2 oz

## 2020-07-28 LAB — APTT: aPTT: 43 seconds — ABNORMAL HIGH (ref 24–36)

## 2020-07-28 LAB — HEMOGLOBIN A1C
Hgb A1c MFr Bld: 9.1 % — ABNORMAL HIGH (ref 4.8–5.6)
Mean Plasma Glucose: 214.47 mg/dL

## 2020-07-28 LAB — CBC
HCT: 39.6 % (ref 39.0–52.0)
Hemoglobin: 12.4 g/dL — ABNORMAL LOW (ref 13.0–17.0)
MCH: 27 pg (ref 26.0–34.0)
MCHC: 31.3 g/dL (ref 30.0–36.0)
MCV: 86.3 fL (ref 80.0–100.0)
Platelets: 415 10*3/uL — ABNORMAL HIGH (ref 150–400)
RBC: 4.59 MIL/uL (ref 4.22–5.81)
RDW: 15.9 % — ABNORMAL HIGH (ref 11.5–15.5)
WBC: 13.7 10*3/uL — ABNORMAL HIGH (ref 4.0–10.5)
nRBC: 0.1 % (ref 0.0–0.2)

## 2020-07-28 LAB — POCT ACTIVATED CLOTTING TIME
Activated Clotting Time: 208 seconds
Activated Clotting Time: 297 seconds
Activated Clotting Time: 154 seconds

## 2020-07-28 LAB — HEPARIN LEVEL (UNFRACTIONATED): Heparin Unfractionated: 0.69 IU/mL (ref 0.30–0.70)

## 2020-07-28 LAB — LEGIONELLA PNEUMOPHILA SEROGP 1 UR AG: L. pneumophila Serogp 1 Ur Ag: NEGATIVE

## 2020-07-28 SURGERY — LEFT HEART CATH AND CORONARY ANGIOGRAPHY
Anesthesia: LOCAL

## 2020-07-28 MED ORDER — SODIUM CHLORIDE 0.9 % WEIGHT BASED INFUSION
1.0000 mL/kg/h | INTRAVENOUS | Status: DC
  Administered 2020-07-28: 1 mL/kg/h via INTRAVENOUS

## 2020-07-28 MED ORDER — HEPARIN (PORCINE) IN NACL 1000-0.9 UT/500ML-% IV SOLN
INTRAVENOUS | Status: DC | PRN
  Administered 2020-07-28 (×2): 500 mL

## 2020-07-28 MED ORDER — SODIUM CHLORIDE 0.9 % WEIGHT BASED INFUSION: 3.0000 mL/kg/h | INTRAVENOUS | Status: AC

## 2020-07-28 MED ORDER — LABETALOL HCL 5 MG/ML IV SOLN
10.0000 mg | INTRAVENOUS | Status: AC | PRN
  Administered 2020-07-28: 10 mg via INTRAVENOUS
  Filled 2020-07-28: qty 4

## 2020-07-28 MED ORDER — MIDAZOLAM HCL 2 MG/2ML IJ SOLN
INTRAMUSCULAR | Status: AC
  Filled 2020-07-28: qty 2

## 2020-07-28 MED ORDER — HEPARIN SODIUM (PORCINE) 1000 UNIT/ML IJ SOLN
INTRAMUSCULAR | Status: DC | PRN
  Administered 2020-07-28: 10000 [IU] via INTRAVENOUS

## 2020-07-28 MED ORDER — ACETAMINOPHEN 325 MG PO TABS: 650.0000 mg | ORAL_TABLET | ORAL | Status: DC | PRN

## 2020-07-28 MED ORDER — ASPIRIN 81 MG PO CHEW
81.0000 mg | CHEWABLE_TABLET | ORAL | Status: AC
  Administered 2020-07-28: 81 mg via ORAL
  Filled 2020-07-28: qty 1

## 2020-07-28 MED ORDER — IOHEXOL 350 MG/ML SOLN
INTRAVENOUS | Status: DC | PRN
  Administered 2020-07-28: 110 mL

## 2020-07-28 MED ORDER — MIDAZOLAM HCL 2 MG/2ML IJ SOLN
INTRAMUSCULAR | Status: DC | PRN
  Administered 2020-07-28: 1 mg via INTRAVENOUS

## 2020-07-28 MED ORDER — VERAPAMIL HCL 2.5 MG/ML IV SOLN
INTRAVENOUS | Status: AC
  Filled 2020-07-28: qty 2

## 2020-07-28 MED ORDER — HEPARIN SODIUM (PORCINE) 1000 UNIT/ML IJ SOLN
INTRAMUSCULAR | Status: AC
  Filled 2020-07-28: qty 1

## 2020-07-28 MED ORDER — SODIUM CHLORIDE 0.9 % IV SOLN: INTRAVENOUS | Status: AC

## 2020-07-28 MED ORDER — FENTANYL CITRATE (PF) 100 MCG/2ML IJ SOLN
INTRAMUSCULAR | Status: AC
  Filled 2020-07-28: qty 2

## 2020-07-28 MED ORDER — ONDANSETRON HCL 4 MG/2ML IJ SOLN: 4.0000 mg | Freq: Four times a day (QID) | INTRAMUSCULAR | Status: DC | PRN

## 2020-07-28 MED ORDER — HEPARIN (PORCINE) IN NACL 1000-0.9 UT/500ML-% IV SOLN
INTRAVENOUS | Status: AC
  Filled 2020-07-28: qty 1000

## 2020-07-28 MED ORDER — HYDRALAZINE HCL 20 MG/ML IJ SOLN: 10.0000 mg | INTRAMUSCULAR | Status: AC | PRN

## 2020-07-28 MED ORDER — LIDOCAINE HCL (PF) 1 % IJ SOLN
INTRAMUSCULAR | Status: DC | PRN
  Administered 2020-07-28: 3 mL
  Administered 2020-07-28: 14 mL

## 2020-07-28 MED ORDER — NITROGLYCERIN 1 MG/10 ML FOR IR/CATH LAB
INTRA_ARTERIAL | Status: AC
  Filled 2020-07-28: qty 10

## 2020-07-28 MED ORDER — ENOXAPARIN SODIUM 40 MG/0.4ML IJ SOSY
40.0000 mg | PREFILLED_SYRINGE | INTRAMUSCULAR | Status: DC
  Administered 2020-07-29: 40 mg via SUBCUTANEOUS
  Filled 2020-07-28: qty 0.4

## 2020-07-28 MED ORDER — FENTANYL CITRATE (PF) 100 MCG/2ML IJ SOLN
INTRAMUSCULAR | Status: DC | PRN
  Administered 2020-07-28: 50 ug via INTRAVENOUS

## 2020-07-28 MED ORDER — SODIUM CHLORIDE 0.9% FLUSH: 3.0000 mL | INTRAVENOUS | Status: DC | PRN

## 2020-07-28 MED ORDER — SODIUM CHLORIDE 0.9 % WEIGHT BASED INFUSION: 1.0000 mL/kg/h | INTRAVENOUS | Status: DC

## 2020-07-28 MED ORDER — SODIUM CHLORIDE 0.9 % WEIGHT BASED INFUSION: 3.0000 mL/kg/h | INTRAVENOUS | Status: DC

## 2020-07-28 MED ORDER — LIDOCAINE HCL (PF) 1 % IJ SOLN
INTRAMUSCULAR | Status: AC
  Filled 2020-07-28: qty 30

## 2020-07-28 MED ORDER — SODIUM CHLORIDE 0.9 % IV SOLN
250.0000 mL | INTRAVENOUS | Status: DC | PRN
  Administered 2020-07-28: 250 mL via INTRAVENOUS

## 2020-07-28 MED ORDER — NITROGLYCERIN 1 MG/10 ML FOR IR/CATH LAB
INTRA_ARTERIAL | Status: DC | PRN
  Administered 2020-07-28 (×2): 200 ug via INTRACORONARY

## 2020-07-28 MED ORDER — PERFLUTREN LIPID MICROSPHERE
1.0000 mL | INTRAVENOUS | Status: AC | PRN
  Administered 2020-07-28: 8 mL via INTRAVENOUS
  Filled 2020-07-28: qty 10

## 2020-07-28 SURGICAL SUPPLY — 18 items
CATH INFINITI 5FR MULTPACK ANG (CATHETERS) ×1 IMPLANT
CATH LAUNCHER 6FR EBU3.5 (CATHETERS) ×1 IMPLANT
CATH LAUNCHER 6FR JR4 (CATHETERS) ×1 IMPLANT
DEVICE RAD COMP TR BAND LRG (VASCULAR PRODUCTS) ×1 IMPLANT
GLIDESHEATH SLEND A-KIT 6F 22G (SHEATH) ×1 IMPLANT
GUIDEWIRE INQWIRE 1.5J.035X260 (WIRE) IMPLANT
GUIDEWIRE PRESSURE X 175 (WIRE) ×1 IMPLANT
INQWIRE 1.5J .035X260CM (WIRE) ×2
KIT ESSENTIALS PG (KITS) ×1 IMPLANT
KIT HEART LEFT (KITS) ×2 IMPLANT
KIT MICROPUNCTURE NIT STIFF (SHEATH) ×1 IMPLANT
MAT PREVALON FULL STRYKER (MISCELLANEOUS) ×1 IMPLANT
PACK CARDIAC CATHETERIZATION (CUSTOM PROCEDURE TRAY) ×2 IMPLANT
SHEATH PINNACLE 5F 10CM (SHEATH) ×1 IMPLANT
SHEATH PINNACLE 6F 10CM (SHEATH) ×1 IMPLANT
TRANSDUCER W/STOPCOCK (MISCELLANEOUS) ×2 IMPLANT
TUBING CIL FLEX 10 FLL-RA (TUBING) ×2 IMPLANT
WIRE EMERALD 3MM-J .035X150CM (WIRE) ×1 IMPLANT

## 2020-07-28 NOTE — Progress Notes (Signed)
TR BAND REMOVAL  LOCATION:    Right radial  DEFLATED PER PROTOCOL:    Yes.    TIME BAND OFF / DRESSING APPLIED:    1910pm A clean dry dressing applied with gauze and tegaderm   SITE UPON ARRIVAL:    Level 0  SITE AFTER BAND REMOVAL:    Level 0  CIRCULATION SENSATION AND MOVEMENT:    Within Normal Limits   Yes.    COMMENTS:   Care instructions given to patient

## 2020-07-28 NOTE — Plan of Care (Signed)

## 2020-07-28 NOTE — Progress Notes (Signed)
ANTICOAGULATION CONSULT NOTE  Pharmacy Consult for heparin  Indication: atrial fibrillation  Allergies  Allergen Reactions  . Canagliflozin Diarrhea and Other (See Comments)    Brand name is Invokana  . Milk-Related Compounds Diarrhea    Patient Measurements: Weight: (!) 167.7 kg (369 lb 11.2 oz)   Vital Signs: Temp: 97.7 F (36.5 C) (05/05 0628) Temp Source: Oral (05/05 0628) BP: 176/73 (05/05 0628) Pulse Rate: 78 (05/04 2338)  Labs: Recent Labs    07/27/20 0211 07/27/20 0320 07/27/20 0433 07/27/20 0921 07/28/20 0507  HGB 13.0  --   --   --  12.4*  HCT 42.4  --   --   --  39.6  PLT 419*  --   --   --  415*  APTT  --   --   --   --  43*  CREATININE 1.30*  --   --   --  1.02  TROPONINIHS  --  19* 22* 29*  --     Estimated Creatinine Clearance: 129.9 mL/min (by C-G formula based on SCr of 1.02 mg/dL).   Assessment: 55 y.o. male with h/o Afib, Eliquis on hold, for heparin  Goal of Therapy:  APTT 66-102 sec Heparin level 0.3-0.7 units/ml Monitor platelets by anticoagulation protocol: Yes   Plan:  Increase Heparin 2300 units/hr APTT in 8 hours  Geannie Risen, PharmD, BCPS  07/28/2020 6:36 AM

## 2020-07-28 NOTE — Progress Notes (Signed)
Site area: Right groin a 6 french arterial sheath was removed  Site Prior to Removal:  Level 0  Pressure Applied For 20 MINUTES    Bedrest Beginning at 1840p X 4 hours  Manual:   Yes.    Patient Status During Pull:  stable  Post Pull Groin Site:  Level 0  Post Pull Instructions Given:  Yes.    Post Pull Pulses Present:  Yes.    Dressing Applied:  Yes.    Comments:

## 2020-07-28 NOTE — Progress Notes (Incomplete)
  Echocardiogram 2D Echocardiogram has been performed.  Gary Franco 07/28/2020, 9:26 AM
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
## 2020-07-28 NOTE — Progress Notes (Addendum)
Reviewed chart. Infectious workup, including procalcitonin essentially normal. Legionella pending. Pulmonary edema remains in the differential. Hypertensive urgency vs obstructive CAD, although less likely. Mildly elevated troponin, risk factors for CAD, previous abnormal stress test. Will proceed with coronary angiography later today.   [name] J [name], MD Pager: [phone] Office: [phone]  

## 2020-07-28 NOTE — Interval H&P Note (Signed)
History and Physical Interval Note:  07/28/2020 1:30 PM  Gary Franco  has presented today for surgery, with the diagnosis of shortness of breath.  The various methods of treatment have been discussed with the patient and family. After consideration of risks, benefits and other options for treatment, the patient has consented to  Procedure(s): LEFT HEART CATH AND CORONARY ANGIOGRAPHY (N/A) as a surgical intervention.  The patient's history has been reviewed, patient examined, no change in status, stable for surgery.  I have reviewed the patient's chart and labs.  Questions were answered to the patient's satisfaction.    2016 Appropriate Use Criteria for Coronary Revascularization in Patients With Acute Coronary Syndrome NSTEMI/UA Intermediate Risk (TIMI Score 3-4) NSTEMI/Unstable angina, stabilized patient at Intermediate Risk (TIMI Score 3-4) Link Here: [employer] Indication:  Revascularization by PCI or CABG of 1 or more arteries in a patient with NSTEMI or unstable angina with Stabilization after presentation Intermediate risk for clinical events  A (7) Indication: 16; Score 7    [name] J Suhaylah Wampole

## 2020-07-28 NOTE — H&P (View-Only) (Signed)
Reviewed chart. Infectious workup, including procalcitonin essentially normal. Legionella pending. Pulmonary edema remains in the differential. Hypertensive urgency vs obstructive CAD, although less likely. Mildly elevated troponin, risk factors for CAD, previous abnormal stress test. Will proceed with coronary angiography later today.   Elder Negus, MD Pager: [phone] Office: [phone]

## 2020-07-29 ENCOUNTER — Encounter (HOSPITAL_COMMUNITY): Payer: Self-pay | Admitting: Cardiology

## 2020-07-29 ENCOUNTER — Other Ambulatory Visit (HOSPITAL_COMMUNITY): Payer: Self-pay

## 2020-07-29 DIAGNOSIS — R0602 Shortness of breath: Secondary | ICD-10-CM | POA: Diagnosis not present

## 2020-07-29 DIAGNOSIS — Z9081 Acquired absence of spleen: Secondary | ICD-10-CM | POA: Diagnosis not present

## 2020-07-29 DIAGNOSIS — Z20822 Contact with and (suspected) exposure to covid-19: Secondary | ICD-10-CM | POA: Diagnosis present

## 2020-07-29 DIAGNOSIS — R7989 Other specified abnormal findings of blood chemistry: Secondary | ICD-10-CM | POA: Diagnosis present

## 2020-07-29 DIAGNOSIS — N529 Male erectile dysfunction, unspecified: Secondary | ICD-10-CM | POA: Diagnosis present

## 2020-07-29 DIAGNOSIS — T467X1A Poisoning by peripheral vasodilators, accidental (unintentional), initial encounter: Secondary | ICD-10-CM | POA: Diagnosis present

## 2020-07-29 DIAGNOSIS — Z8249 Family history of ischemic heart disease and other diseases of the circulatory system: Secondary | ICD-10-CM | POA: Diagnosis not present

## 2020-07-29 DIAGNOSIS — J9601 Acute respiratory failure with hypoxia: Secondary | ICD-10-CM | POA: Diagnosis present

## 2020-07-29 DIAGNOSIS — Z83438 Family history of other disorder of lipoprotein metabolism and other lipidemia: Secondary | ICD-10-CM | POA: Diagnosis not present

## 2020-07-29 DIAGNOSIS — Z7901 Long term (current) use of anticoagulants: Secondary | ICD-10-CM | POA: Diagnosis not present

## 2020-07-29 DIAGNOSIS — E785 Hyperlipidemia, unspecified: Secondary | ICD-10-CM | POA: Diagnosis present

## 2020-07-29 DIAGNOSIS — J189 Pneumonia, unspecified organism: Secondary | ICD-10-CM | POA: Diagnosis present

## 2020-07-29 DIAGNOSIS — K219 Gastro-esophageal reflux disease without esophagitis: Secondary | ICD-10-CM | POA: Diagnosis present

## 2020-07-29 DIAGNOSIS — I480 Paroxysmal atrial fibrillation: Secondary | ICD-10-CM | POA: Diagnosis present

## 2020-07-29 DIAGNOSIS — I10 Essential (primary) hypertension: Secondary | ICD-10-CM | POA: Diagnosis not present

## 2020-07-29 DIAGNOSIS — I161 Hypertensive emergency: Secondary | ICD-10-CM | POA: Diagnosis present

## 2020-07-29 DIAGNOSIS — E119 Type 2 diabetes mellitus without complications: Secondary | ICD-10-CM | POA: Diagnosis present

## 2020-07-29 DIAGNOSIS — R778 Other specified abnormalities of plasma proteins: Secondary | ICD-10-CM | POA: Diagnosis not present

## 2020-07-29 DIAGNOSIS — Z794 Long term (current) use of insulin: Secondary | ICD-10-CM | POA: Diagnosis not present

## 2020-07-29 DIAGNOSIS — I248 Other forms of acute ischemic heart disease: Secondary | ICD-10-CM | POA: Diagnosis present

## 2020-07-29 DIAGNOSIS — G4733 Obstructive sleep apnea (adult) (pediatric): Secondary | ICD-10-CM | POA: Diagnosis present

## 2020-07-29 DIAGNOSIS — Z833 Family history of diabetes mellitus: Secondary | ICD-10-CM | POA: Diagnosis not present

## 2020-07-29 DIAGNOSIS — G8929 Other chronic pain: Secondary | ICD-10-CM | POA: Diagnosis present

## 2020-07-29 DIAGNOSIS — Z79899 Other long term (current) drug therapy: Secondary | ICD-10-CM | POA: Diagnosis not present

## 2020-07-29 DIAGNOSIS — Z6841 Body Mass Index (BMI) 40.0 and over, adult: Secondary | ICD-10-CM | POA: Diagnosis not present

## 2020-07-29 LAB — BASIC METABOLIC PANEL
Anion gap: 6 (ref 5–15)
BUN: 9 mg/dL (ref 6–20)
CO2: 25 mmol/L (ref 22–32)
Calcium: 8.5 mg/dL — ABNORMAL LOW (ref 8.9–10.3)
Chloride: 100 mmol/L (ref 98–111)
Creatinine, Ser: 1.12 mg/dL (ref 0.61–1.24)
GFR, Estimated: >60 mL/min (ref >60)
Glucose, Bld: 275 mg/dL — ABNORMAL HIGH (ref 70–99)
Potassium: 4.1 mmol/L (ref 3.5–5.1)
Sodium: 131 mmol/L — ABNORMAL LOW (ref 135–145)

## 2020-07-29 LAB — GLUCOSE, CAPILLARY
Glucose-Capillary: 271 mg/dL — ABNORMAL HIGH (ref 70–99)
Glucose-Capillary: 255 mg/dL — ABNORMAL HIGH (ref 70–99)
Glucose-Capillary: 215 mg/dL — ABNORMAL HIGH (ref 70–99)

## 2020-07-29 LAB — CBC
HCT: 39.1 % (ref 39.0–52.0)
Hemoglobin: 12.5 g/dL — ABNORMAL LOW (ref 13.0–17.0)
MCH: 27.4 pg (ref 26.0–34.0)
MCHC: 32 g/dL (ref 30.0–36.0)
MCV: 85.7 fL (ref 80.0–100.0)
Platelets: 409 10*3/uL — ABNORMAL HIGH (ref 150–400)
RBC: 4.56 MIL/uL (ref 4.22–5.81)
RDW: 15.7 % — ABNORMAL HIGH (ref 11.5–15.5)
WBC: 13.4 10*3/uL — ABNORMAL HIGH (ref 4.0–10.5)
nRBC: 0 % (ref 0.0–0.2)

## 2020-07-29 MED ORDER — SODIUM CHLORIDE 0.9% FLUSH: 3.0000 mL | INTRAVENOUS | Status: DC | PRN

## 2020-07-29 MED ORDER — LISINOPRIL-HYDROCHLOROTHIAZIDE 20-12.5 MG PO TABS
2.0000 | ORAL_TABLET | Freq: Two times a day (BID) | ORAL | 0 refills | Status: DC
  Filled 2020-07-29: qty 120, 30d supply, fill #0

## 2020-07-29 MED ORDER — CEFDINIR 300 MG PO CAPS
300.0000 mg | ORAL_CAPSULE | Freq: Two times a day (BID) | ORAL | 0 refills | Status: AC
  Filled 2020-07-29: qty 8, 4d supply, fill #0

## 2020-07-29 MED ORDER — SODIUM CHLORIDE 0.9% FLUSH: 3.0000 mL | Freq: Two times a day (BID) | INTRAVENOUS | Status: DC

## 2020-07-29 MED ORDER — SODIUM CHLORIDE 0.9 % IV SOLN: 250.0000 mL | INTRAVENOUS | Status: DC | PRN

## 2020-07-29 MED ORDER — LISINOPRIL-HYDROCHLOROTHIAZIDE 20-12.5 MG PO TABS: 2.0000 | ORAL_TABLET | Freq: Every day | ORAL | 0 refills | Status: DC

## 2020-07-29 MED ORDER — AZITHROMYCIN 250 MG PO TABS
ORAL_TABLET | ORAL | 0 refills | Status: DC
  Filled 2020-07-29: qty 4, 4d supply, fill #0

## 2020-07-29 MED ORDER — LIVING WELL WITH DIABETES BOOK
Freq: Once | Status: AC
  Filled 2020-07-29: qty 1

## 2020-07-29 MED ORDER — LISINOPRIL-HYDROCHLOROTHIAZIDE 20-12.5 MG PO TABS: 1.0000 | ORAL_TABLET | Freq: Every day | ORAL | 0 refills | Status: DC

## 2020-07-29 MED ORDER — INSULIN ASPART PROT & ASPART (70-30 MIX) 100 UNIT/ML ~~LOC~~ SUSP
20.0000 [IU] | Freq: Two times a day (BID) | SUBCUTANEOUS | Status: DC
  Administered 2020-07-29: 20 [IU] via SUBCUTANEOUS
  Filled 2020-07-29 (×2): qty 10

## 2020-07-29 MED FILL — Verapamil HCl IV Soln 2.5 MG/ML: INTRAVENOUS | Qty: 2 | Status: AC

## 2020-07-29 NOTE — Progress Notes (Signed)
Received order however pt does not have an appropriate dx for CR. Will not follow. Ethelda Chick CES, ACSM 2:14 PM 07/29/2020

## 2020-07-29 NOTE — Progress Notes (Addendum)
Inpatient Diabetes Program Recommendations  AACE/ADA: New Consensus Statement on Inpatient Glycemic Control (2015)  Target Ranges:  Prepandial:   less than 140 mg/dL      Peak postprandial:   less than 180 mg/dL (1-2 hours)      Critically ill patients:  140 - 180 mg/dL   Lab Results  Component Value Date   GLUCAP 271 (H) 07/29/2020   HGBA1C 9.1 (H) 07/28/2020    Diabetes history:  DM2 Outpatient Diabetes medications:  75/25 10-20 units BID Ozempic 0.5 mg every Monday Current orders for Inpatient glycemic control:  70/30 10 units BID  Inpatient Diabetes Program Recommendations:    Novolog 0-15 units TID and 0-5 QHS Add carb modified diet  A1C 9.1%.  Will speak with patient today.  Will continue to follow while inpatient.  Thank you, Dulce Sellar, RN, BSN Diabetes Coordinator Inpatient Diabetes Program [phone] (team pager from 8a-5p)

## 2020-07-29 NOTE — Progress Notes (Signed)
Heart Failure Nurse Navigator Progress Note  Screened for HV TOC readiness, does not qualify this admission as related to medication noncompliance and hypertensive urgency.   Ozella Rocks, RN, BSN Heart Failure Nurse Navigator [phone]
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
## 2020-07-29 NOTE — Discharge Instructions (Signed)
Resume your eliquis on 07/30/20

## 2020-07-29 NOTE — Plan of Care (Signed)

## 2020-07-29 NOTE — Progress Notes (Signed)
D/C instructions given and reviewed. Tele and IV's removed, tolerated well. Pt requesting to shower prior to D/C.

## 2020-08-02 ENCOUNTER — Telehealth: Payer: Self-pay

## 2020-08-02 ENCOUNTER — Other Ambulatory Visit: Payer: Self-pay | Admitting: Internal Medicine

## 2020-08-02 NOTE — Telephone Encounter (Signed)
Spoke with the patient. Addressed the confusion.

## 2020-08-03 LAB — CBC
HCT: 40.3 % (ref 38.5–50.0)
Hemoglobin: 12.5 g/dL — ABNORMAL LOW (ref 13.2–17.1)
MCH: 26.9 pg — ABNORMAL LOW (ref 27.0–33.0)
MCHC: 31 g/dL — ABNORMAL LOW (ref 32.0–36.0)
MCV: 86.7 fL (ref 80.0–100.0)
MPV: 9.1 fL (ref 7.5–12.5)
Platelets: 442 10*3/uL — ABNORMAL HIGH (ref 140–400)
RBC: 4.65 10*6/uL (ref 4.20–5.80)
RDW: 14.1 % (ref 11.0–15.0)
WBC: 11.7 10*3/uL — ABNORMAL HIGH (ref 3.8–10.8)

## 2020-08-03 LAB — BASIC METABOLIC PANEL WITH GFR
BUN: 12 mg/dL (ref 7–25)
CO2: 27 mmol/L (ref 20–32)
Calcium: 9.2 mg/dL (ref 8.6–10.3)
Chloride: 100 mmol/L (ref 98–110)
Creat: 1.15 mg/dL (ref 0.70–1.33)
GFR, Est African American: 82 mL/min/{1.73_m2} (ref >=60)
GFR, Est Non African American: 71 mL/min/{1.73_m2} (ref >=60)
Glucose, Bld: 190 mg/dL — ABNORMAL HIGH (ref 65–99)
Potassium: 4.7 mmol/L (ref 3.5–5.3)
Sodium: 137 mmol/L (ref 135–146)
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
## 2020-08-08 ENCOUNTER — Encounter: Payer: Self-pay | Admitting: Student

## 2020-08-08 ENCOUNTER — Ambulatory Visit: Payer: Medicaid Other | Admitting: Student

## 2020-08-08 ENCOUNTER — Other Ambulatory Visit: Payer: Self-pay

## 2020-08-08 VITALS — BP 178/71 | HR 77 | Temp 98.7°F | Resp 16 | Ht 72.0 in | Wt 368.6 lb

## 2020-08-08 DIAGNOSIS — I480 Paroxysmal atrial fibrillation: Secondary | ICD-10-CM

## 2020-08-08 DIAGNOSIS — I2510 Atherosclerotic heart disease of native coronary artery without angina pectoris: Secondary | ICD-10-CM

## 2020-08-08 DIAGNOSIS — I10 Essential (primary) hypertension: Secondary | ICD-10-CM

## 2020-09-13 ENCOUNTER — Other Ambulatory Visit: Payer: Self-pay | Admitting: Cardiology

## 2020-09-22 ENCOUNTER — Other Ambulatory Visit: Payer: Self-pay | Admitting: Internal Medicine

## 2020-09-26 LAB — TESTOSTERONE, FREE & TOTAL
Free Testosterone: 14.5 pg/mL — ABNORMAL LOW (ref 35.0–155.0)
Testosterone, Total, LC-MS-MS: 108 ng/dL — ABNORMAL LOW (ref 250–1100)

## 2020-09-29 LAB — LIPID PANEL WITH LDL/HDL RATIO
Cholesterol, Total: 157 mg/dL (ref 100–199)
HDL: 30 mg/dL — ABNORMAL LOW (ref >39)
LDL Chol Calc (NIH): 107 mg/dL — ABNORMAL HIGH (ref 0–99)
LDL/HDL Ratio: 3.6 ratio (ref 0.0–3.6)
Triglycerides: 107 mg/dL (ref 0–149)
VLDL Cholesterol Cal: 20 mg/dL (ref 5–40)

## 2020-09-29 LAB — BASIC METABOLIC PANEL
BUN/Creatinine Ratio: 16 (ref 9–20)
BUN: 21 mg/dL (ref 6–24)
CO2: 26 mmol/L (ref 20–29)
Calcium: 9.3 mg/dL (ref 8.7–10.2)
Chloride: 98 mmol/L (ref 96–106)
Creatinine, Ser: 1.34 mg/dL — ABNORMAL HIGH (ref 0.76–1.27)
Glucose: 338 mg/dL — ABNORMAL HIGH (ref 65–99)
Potassium: 5 mmol/L (ref 3.5–5.2)
Sodium: 137 mmol/L (ref 134–144)
eGFR: 62 mL/min/{1.73_m2} (ref >59)

## 2020-09-29 NOTE — Progress Notes (Signed)
Please call patient and inform him that renal function deteriorated slightly, advised him to go back to taking 1 tablet of chlorothiazide combination once today.  We will discuss further options at upcoming office visit.
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
## 2020-09-30 NOTE — Progress Notes (Signed)
Called and spoke with patient regarding his lab results.

## 2020-10-03 ENCOUNTER — Ambulatory Visit: Payer: Medicaid Other | Admitting: Student

## 2020-10-03 ENCOUNTER — Encounter: Payer: Self-pay | Admitting: Student

## 2020-10-03 ENCOUNTER — Other Ambulatory Visit: Payer: Self-pay

## 2020-10-03 VITALS — BP 126/69 | HR 69 | Temp 97.2°F | Resp 16 | Ht 72.0 in | Wt 360.0 lb

## 2020-10-03 DIAGNOSIS — E7800 Pure hypercholesterolemia, unspecified: Secondary | ICD-10-CM

## 2020-10-03 DIAGNOSIS — G4733 Obstructive sleep apnea (adult) (pediatric): Secondary | ICD-10-CM

## 2020-10-03 DIAGNOSIS — I10 Essential (primary) hypertension: Secondary | ICD-10-CM

## 2020-10-03 DIAGNOSIS — I480 Paroxysmal atrial fibrillation: Secondary | ICD-10-CM

## 2020-10-03 DIAGNOSIS — I2510 Atherosclerotic heart disease of native coronary artery without angina pectoris: Secondary | ICD-10-CM

## 2020-10-03 MED ORDER — HYDRALAZINE HCL 25 MG PO TABS: 25.0000 mg | ORAL_TABLET | Freq: Three times a day (TID) | ORAL | 3 refills | Status: DC

## 2020-12-12 ENCOUNTER — Other Ambulatory Visit: Payer: Self-pay | Admitting: Cardiology

## 2020-12-27 ENCOUNTER — Ambulatory Visit: Payer: Self-pay | Admitting: Family Medicine

## 2021-01-02 ENCOUNTER — Ambulatory Visit: Payer: Medicaid Other | Admitting: Student

## 2021-01-02 ENCOUNTER — Encounter: Payer: Self-pay | Admitting: Student

## 2021-01-02 ENCOUNTER — Other Ambulatory Visit: Payer: Self-pay

## 2021-01-02 VITALS — BP 174/76 | HR 71 | Temp 98.1°F | Resp 12 | Ht 72.0 in | Wt 358.0 lb

## 2021-01-02 DIAGNOSIS — I10 Essential (primary) hypertension: Secondary | ICD-10-CM

## 2021-01-02 DIAGNOSIS — G4733 Obstructive sleep apnea (adult) (pediatric): Secondary | ICD-10-CM

## 2021-01-02 DIAGNOSIS — I2510 Atherosclerotic heart disease of native coronary artery without angina pectoris: Secondary | ICD-10-CM

## 2021-01-02 DIAGNOSIS — E7800 Pure hypercholesterolemia, unspecified: Secondary | ICD-10-CM

## 2021-01-02 DIAGNOSIS — I480 Paroxysmal atrial fibrillation: Secondary | ICD-10-CM
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
## 2021-01-09 ENCOUNTER — Ambulatory Visit: Payer: Medicaid Other | Admitting: Student

## 2021-01-09 ENCOUNTER — Other Ambulatory Visit: Payer: Self-pay

## 2021-01-09 VITALS — BP 169/68 | HR 66

## 2021-01-09 DIAGNOSIS — I10 Essential (primary) hypertension: Secondary | ICD-10-CM

## 2021-01-09 MED ORDER — HYDRALAZINE HCL 25 MG PO TABS
50.0000 mg | ORAL_TABLET | Freq: Three times a day (TID) | ORAL | 3 refills | Status: DC
Start: 2021-01-09 — End: 2021-12-20

## 2021-01-09 NOTE — Progress Notes (Signed)
Patient's blood pressure remains uncontrolled.  We will increase hydralazine from 25 mg to 50 mg 3 times daily.  She will continue to monitor his blood pressure at home and notify our office if it remains >130/80 mmHg.

## 2021-01-30 ENCOUNTER — Encounter: Payer: Self-pay | Admitting: Family Medicine

## 2021-01-30 ENCOUNTER — Ambulatory Visit: Payer: Medicaid Other | Admitting: Family Medicine

## 2021-01-30 VITALS — BP 138/73 | HR 66 | Ht 72.0 in | Wt 350.0 lb

## 2021-01-30 DIAGNOSIS — G4733 Obstructive sleep apnea (adult) (pediatric): Secondary | ICD-10-CM | POA: Diagnosis not present

## 2021-01-30 NOTE — Progress Notes (Signed)
CM sent to [facility] 

## 2021-01-30 NOTE — Patient Instructions (Addendum)
Please start using your CPAP regularly. While your insurance requires that you use CPAP at least 4 hours each night on 70% of the nights, I recommend, that you not skip any nights and use it throughout the night if you can. Getting used to CPAP and staying with the treatment long term does take time and patience and discipline. Untreated obstructive sleep apnea when it is moderate to severe can have an adverse impact on cardiovascular health and raise her risk for heart disease, arrhythmias, hypertension, congestive heart failure, stroke and diabetes. Untreated obstructive sleep apnea causes sleep disruption, nonrestorative sleep, and sleep deprivation. This can have an impact on your day to day functioning and cause daytime sleepiness and impairment of cognitive function, memory loss, mood disturbance, and problems focussing. Using CPAP regularly can improve these symptoms.   I will send orders for supplies. Follow up in 3 months     DME: [facility] Phone: [phone], press option 1
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
## 2021-02-28 ENCOUNTER — Encounter: Payer: Self-pay | Admitting: Student

## 2021-03-12 ENCOUNTER — Other Ambulatory Visit: Payer: Self-pay | Admitting: Cardiology

## 2021-03-12 ENCOUNTER — Other Ambulatory Visit: Payer: Self-pay | Admitting: Student

## 2021-03-12 DIAGNOSIS — I480 Paroxysmal atrial fibrillation: Secondary | ICD-10-CM

## 2021-04-24 ENCOUNTER — Ambulatory Visit: Payer: Medicaid Other | Admitting: Family Medicine
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
## 2021-05-03 NOTE — Patient Instructions (Incomplete)

## 2021-05-04 ENCOUNTER — Encounter: Payer: Self-pay | Admitting: Family Medicine

## 2021-05-04 ENCOUNTER — Ambulatory Visit: Payer: Medicaid Other | Admitting: Family Medicine

## 2021-05-04 DIAGNOSIS — G4733 Obstructive sleep apnea (adult) (pediatric): Secondary | ICD-10-CM
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
## 2021-05-22 ENCOUNTER — Encounter (INDEPENDENT_AMBULATORY_CARE_PROVIDER_SITE_OTHER): Payer: Self-pay | Admitting: Ophthalmology

## 2021-05-22 ENCOUNTER — Ambulatory Visit (INDEPENDENT_AMBULATORY_CARE_PROVIDER_SITE_OTHER): Payer: Medicaid Other | Admitting: Ophthalmology

## 2021-05-22 ENCOUNTER — Other Ambulatory Visit: Payer: Self-pay

## 2021-05-22 DIAGNOSIS — H35033 Hypertensive retinopathy, bilateral: Secondary | ICD-10-CM | POA: Diagnosis not present

## 2021-05-22 DIAGNOSIS — E113212 Type 2 diabetes mellitus with mild nonproliferative diabetic retinopathy with macular edema, left eye: Secondary | ICD-10-CM | POA: Diagnosis not present

## 2021-05-22 DIAGNOSIS — H25813 Combined forms of age-related cataract, bilateral: Secondary | ICD-10-CM | POA: Diagnosis not present

## 2021-05-22 DIAGNOSIS — I10 Essential (primary) hypertension: Secondary | ICD-10-CM

## 2021-05-22 DIAGNOSIS — H3581 Retinal edema: Secondary | ICD-10-CM

## 2021-06-01 ENCOUNTER — Telehealth: Payer: Self-pay

## 2021-06-01 ENCOUNTER — Telehealth: Payer: Self-pay | Admitting: Student

## 2021-06-01 NOTE — Telephone Encounter (Signed)
We can see him some time in the next couple weeks for follow up. Just add him to the schedule in the next 2 weeks.

## 2021-06-01 NOTE — Telephone Encounter (Signed)
Tried calling patient no answer left a vm

## 2021-06-01 NOTE — Telephone Encounter (Signed)
Pt aware. Appt has been made for 06/08/2021 at 10:45 am.

## 2021-06-01 NOTE — Telephone Encounter (Signed)
Patient returning phone call, says someone tried to get in touch with him. Please call him back. ?
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
## 2021-06-08 ENCOUNTER — Encounter: Payer: Self-pay | Admitting: Student

## 2021-06-08 ENCOUNTER — Other Ambulatory Visit: Payer: Self-pay

## 2021-06-08 ENCOUNTER — Ambulatory Visit: Payer: Medicaid Other | Admitting: Student

## 2021-06-08 VITALS — BP 128/75 | HR 55 | Temp 98.9°F | Resp 12 | Ht 72.0 in | Wt 347.0 lb

## 2021-06-08 DIAGNOSIS — I2510 Atherosclerotic heart disease of native coronary artery without angina pectoris: Secondary | ICD-10-CM

## 2021-06-08 DIAGNOSIS — I10 Essential (primary) hypertension: Secondary | ICD-10-CM

## 2021-06-08 DIAGNOSIS — I480 Paroxysmal atrial fibrillation: Secondary | ICD-10-CM

## 2021-06-09 MED ORDER — EZETIMIBE 10 MG PO TABS: 10.0000 mg | ORAL_TABLET | Freq: Every day | ORAL | 3 refills | Status: DC

## 2021-06-09 NOTE — Addendum Note (Signed)
Addended by: Rosezena Sensor on: 06/09/2021 12:24 PM ? ? Modules accepted: Orders ? ?

## 2021-07-03 ENCOUNTER — Ambulatory Visit: Payer: Medicaid Other | Admitting: Student

## 2021-07-13 ENCOUNTER — Ambulatory Visit: Payer: Medicaid Other | Admitting: Cardiology

## 2021-08-22 ENCOUNTER — Encounter (INDEPENDENT_AMBULATORY_CARE_PROVIDER_SITE_OTHER): Payer: Medicaid Other | Admitting: Ophthalmology

## 2021-08-22 ENCOUNTER — Encounter (INDEPENDENT_AMBULATORY_CARE_PROVIDER_SITE_OTHER): Payer: Self-pay

## 2021-08-22 DIAGNOSIS — I10 Essential (primary) hypertension: Secondary | ICD-10-CM

## 2021-08-22 DIAGNOSIS — E113212 Type 2 diabetes mellitus with mild nonproliferative diabetic retinopathy with macular edema, left eye: Secondary | ICD-10-CM

## 2021-08-22 DIAGNOSIS — H35033 Hypertensive retinopathy, bilateral: Secondary | ICD-10-CM

## 2021-08-22 DIAGNOSIS — H25813 Combined forms of age-related cataract, bilateral: Secondary | ICD-10-CM

## 2021-08-23 ENCOUNTER — Encounter (INDEPENDENT_AMBULATORY_CARE_PROVIDER_SITE_OTHER): Payer: Self-pay | Admitting: Ophthalmology

## 2021-08-23 ENCOUNTER — Ambulatory Visit (INDEPENDENT_AMBULATORY_CARE_PROVIDER_SITE_OTHER): Payer: Medicaid Other | Admitting: Ophthalmology

## 2021-08-23 DIAGNOSIS — E113212 Type 2 diabetes mellitus with mild nonproliferative diabetic retinopathy with macular edema, left eye: Secondary | ICD-10-CM

## 2021-08-23 DIAGNOSIS — H25813 Combined forms of age-related cataract, bilateral: Secondary | ICD-10-CM

## 2021-08-23 DIAGNOSIS — I10 Essential (primary) hypertension: Secondary | ICD-10-CM | POA: Diagnosis not present

## 2021-08-23 DIAGNOSIS — H35033 Hypertensive retinopathy, bilateral: Secondary | ICD-10-CM | POA: Diagnosis not present
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
## 2021-08-25 ENCOUNTER — Encounter (INDEPENDENT_AMBULATORY_CARE_PROVIDER_SITE_OTHER): Payer: Self-pay | Admitting: Ophthalmology

## 2021-10-25 ENCOUNTER — Other Ambulatory Visit: Payer: Self-pay

## 2021-12-06 ENCOUNTER — Ambulatory Visit: Payer: Medicaid Other | Admitting: Cardiology

## 2021-12-07 ENCOUNTER — Encounter: Payer: Self-pay | Admitting: Cardiology

## 2021-12-07 ENCOUNTER — Ambulatory Visit: Payer: Medicaid Other | Admitting: Cardiology

## 2021-12-07 VITALS — BP 167/79 | HR 57 | Temp 98.0°F | Resp 17 | Ht 72.0 in | Wt 348.0 lb

## 2021-12-07 DIAGNOSIS — I480 Paroxysmal atrial fibrillation: Secondary | ICD-10-CM

## 2021-12-07 DIAGNOSIS — I10 Essential (primary) hypertension: Secondary | ICD-10-CM

## 2021-12-07 MED ORDER — DILTIAZEM HCL ER COATED BEADS 240 MG PO CP24: 240.0000 mg | ORAL_CAPSULE | Freq: Every day | ORAL | 3 refills | Status: DC

## 2021-12-07 MED ORDER — TADALAFIL 10 MG PO TABS: 10.0000 mg | ORAL_TABLET | Freq: Every day | ORAL | 2 refills | Status: DC | PRN
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
## 2021-12-08 ENCOUNTER — Ambulatory Visit: Payer: Medicaid Other | Admitting: Student

## 2021-12-18 NOTE — Telephone Encounter (Signed)
error 

## 2021-12-20 ENCOUNTER — Telehealth: Payer: Self-pay

## 2021-12-20 ENCOUNTER — Other Ambulatory Visit: Payer: Self-pay

## 2021-12-20 DIAGNOSIS — I10 Essential (primary) hypertension: Secondary | ICD-10-CM

## 2021-12-20 MED ORDER — LISINOPRIL-HYDROCHLOROTHIAZIDE 20-12.5 MG PO TABS: 2.0000 | ORAL_TABLET | Freq: Two times a day (BID) | ORAL | 2 refills | Status: DC

## 2021-12-20 MED ORDER — HYDRALAZINE HCL 25 MG PO TABS: 50.0000 mg | ORAL_TABLET | Freq: Three times a day (TID) | ORAL | 2 refills | Status: DC

## 2021-12-20 MED ORDER — LISINOPRIL-HYDROCHLOROTHIAZIDE 20-12.5 MG PO TABS: 1.0000 | ORAL_TABLET | Freq: Two times a day (BID) | ORAL | 2 refills | Status: DC

## 2021-12-20 NOTE — Telephone Encounter (Signed)
Lisinopril-HCTZ 2 tab bid may be too much. Maybe increase hydralazine instead to 2 tab TID? Next, we can add isordil, if needed.  Thanks MJP

## 2021-12-20 NOTE — Telephone Encounter (Signed)
Patient is aware he is to take:  Clonidine 0.2mg  1 tab BID Hydralazine 25mg  2 tabs TID Lisinopril-HCTZ 20-12.5mg  1 tab BID  Do you still want a BMP in 1 week?

## 2021-12-20 NOTE — Telephone Encounter (Signed)
We can increase lisinopril-HCTZ 20-12.5 to 2 tabs a day, or change to valsartan-HCTZ 160-12.5 daily. (Lisinopril-HCTZ does not come in 40-25 to my knowledge). We should repeat a BMP 1 week after the change.  Thanks MJP

## 2021-12-20 NOTE — Telephone Encounter (Signed)
Follow-up readings for RPM home BP monitoring.   Average Systolic BP Level 518 mmHg Lowest Systolic BP Level 984 mmHg Highest Systolic BP Level 210 mmHg  12/19/2021 Tuesday at 04:51 PM 163 / 104      12/17/2021 Sunday at 08:15 PM 172 / 109      12/17/2021 Sunday at 07:32 AM 154 / 92      12/16/2021 Saturday at 09:07 AM 163 / 100      12/15/2021 Friday at 10:38 PM 146 / 88      12/14/2021 Thursday at 10:25 AM 152 / 97      12/13/2021 Wednesday at 11:46 AM 143 / 86      09 /20/2023 Wednesday at 09:40 AM 162 / 95

## 2021-12-20 NOTE — Telephone Encounter (Signed)
Med list updated to reflect what patient is taking.   Patient at max daily dose of lisinopril (80mg ) and HCTZ (50mg )

## 2022-01-16 ENCOUNTER — Other Ambulatory Visit: Payer: Self-pay

## 2022-01-16 ENCOUNTER — Emergency Department (HOSPITAL_COMMUNITY)
Admission: EM | Admit: 2022-01-16 | Discharge: 2022-01-16 | Disposition: A | Discharge status: Home / Self Care | Payer: Medicaid Other | Attending: Emergency Medicine | Admitting: Emergency Medicine

## 2022-01-16 ENCOUNTER — Emergency Department (HOSPITAL_COMMUNITY): Payer: Medicaid Other

## 2022-01-16 ENCOUNTER — Encounter (HOSPITAL_COMMUNITY): Payer: Self-pay | Admitting: Emergency Medicine

## 2022-01-16 DIAGNOSIS — Z7901 Long term (current) use of anticoagulants: Secondary | ICD-10-CM | POA: Insufficient documentation

## 2022-01-16 DIAGNOSIS — R0600 Dyspnea, unspecified: Secondary | ICD-10-CM | POA: Diagnosis not present

## 2022-01-16 DIAGNOSIS — Z79899 Other long term (current) drug therapy: Secondary | ICD-10-CM | POA: Diagnosis not present

## 2022-01-16 DIAGNOSIS — I10 Essential (primary) hypertension: Secondary | ICD-10-CM | POA: Diagnosis not present

## 2022-01-16 DIAGNOSIS — Z794 Long term (current) use of insulin: Secondary | ICD-10-CM | POA: Diagnosis not present

## 2022-01-16 DIAGNOSIS — R0602 Shortness of breath: Secondary | ICD-10-CM | POA: Diagnosis present

## 2022-01-16 LAB — CBC WITH DIFFERENTIAL/PLATELET
Abs Immature Granulocytes: 0.05 10*3/uL (ref 0.00–0.07)
Basophils Absolute: 0.1 10*3/uL (ref 0.0–0.1)
Basophils Relative: 1 %
Eosinophils Absolute: 0.3 10*3/uL (ref 0.0–0.5)
Eosinophils Relative: 2 %
HCT: 37.9 % — ABNORMAL LOW (ref 39.0–52.0)
Hemoglobin: 12.1 g/dL — ABNORMAL LOW (ref 13.0–17.0)
Immature Granulocytes: 0 %
Lymphocytes Relative: 38 %
Lymphs Abs: 5.4 10*3/uL — ABNORMAL HIGH (ref 0.7–4.0)
MCH: 26.9 pg (ref 26.0–34.0)
MCHC: 31.9 g/dL (ref 30.0–36.0)
MCV: 84.2 fL (ref 80.0–100.0)
Monocytes Absolute: 1.2 10*3/uL — ABNORMAL HIGH (ref 0.1–1.0)
Monocytes Relative: 8 %
Neutro Abs: 7.2 10*3/uL (ref 1.7–7.7)
Neutrophils Relative %: 51 %
Platelets: 438 10*3/uL — ABNORMAL HIGH (ref 150–400)
RBC: 4.5 MIL/uL (ref 4.22–5.81)
RDW: 15.3 % (ref 11.5–15.5)
WBC: 14.2 10*3/uL — ABNORMAL HIGH (ref 4.0–10.5)
nRBC: 0 % (ref 0.0–0.2)

## 2022-01-16 LAB — BASIC METABOLIC PANEL
Anion gap: 9 (ref 5–15)
BUN: 15 mg/dL (ref 6–20)
CO2: 25 mmol/L (ref 22–32)
Calcium: 8.8 mg/dL — ABNORMAL LOW (ref 8.9–10.3)
Chloride: 103 mmol/L (ref 98–111)
Creatinine, Ser: 1.14 mg/dL (ref 0.61–1.24)
GFR, Estimated: >60 mL/min (ref >60)
Glucose, Bld: 203 mg/dL — ABNORMAL HIGH (ref 70–99)
Potassium: 3.9 mmol/L (ref 3.5–5.1)
Sodium: 137 mmol/L (ref 135–145)

## 2022-01-16 LAB — TROPONIN I (HIGH SENSITIVITY)
Troponin I (High Sensitivity): 21 ng/L — ABNORMAL HIGH (ref <18)
Troponin I (High Sensitivity): 26 ng/L — ABNORMAL HIGH (ref <18)

## 2022-01-16 LAB — BRAIN NATRIURETIC PEPTIDE: B Natriuretic Peptide: 58.2 pg/mL (ref 0.0–100.0)

## 2022-01-16 MED ORDER — IOHEXOL 350 MG/ML SOLN
80.0000 mL | Freq: Once | INTRAVENOUS | Status: AC | PRN
  Administered 2022-01-16: 80 mL via INTRAVENOUS

## 2022-01-16 NOTE — Discharge Instructions (Signed)
Your studies today do not show any definite reason for your shortness of breath. Please follow up with your doctor Return if you are worse or have new symptoms

## 2022-01-16 NOTE — ED Provider Triage Note (Signed)
Emergency Medicine Provider Triage Evaluation Note  Gary Franco , a 56 y.o. male  was evaluated in triage.  Pt with history of CAD chronic anticoagulation with Eliquis who presents with concern for several days of progressive worsening shortness of breath only when he lies flat as well as chest tightness.  Does endorse history of productive cough with small blood clots last week for which she was seen in an ER in [place] while out of town.  He states that they wanted to admit him to the hospital but he preferred to return home to undergo care with his primary cardiologist.  States that they wanted admitted for IV antibiotics.  Did not discharge with any antibiotics.  Review of Systems  Positive: As above Negative: Chest pain, syncope  Physical Exam  BP (!) 168/68 (BP Location: Right Arm)   Pulse 72   Temp 98.3 F (36.8 C) (Oral)   Resp 20   SpO2 97%  Gen:   Awake, no distress   Resp:  Normal effort  MSK:   Moves extremities without difficulty  Other:  RRR no M/R/G.  Lungs CTA B.  No lower extremity edema  Medical Decision Making  Medically screening exam initiated at 12:49 AM.  Appropriate orders placed.  [name] was informed that the remainder of the evaluation will be completed by another provider, this initial triage assessment does not replace that evaluation, and the importance of remaining in the ED until their evaluation is complete.  This chart was dictated using voice recognition software, Dragon. Despite the best efforts of this provider to proofread and correct errors, errors may still occur which can change documentation meaning.    Emeline Darling, PA-C 01/16/22 [phone]

## 2022-01-16 NOTE — ED Triage Notes (Signed)
Patient reports SOB with chest tightness and productive cough for several days .

## 2022-01-16 NOTE — ED Notes (Signed)
Unsuccessful IV attempts x2, will consult IV team
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
## 2022-03-20 ENCOUNTER — Other Ambulatory Visit: Payer: Self-pay | Admitting: Cardiology

## 2022-03-20 DIAGNOSIS — I10 Essential (primary) hypertension: Secondary | ICD-10-CM

## 2022-03-28 ENCOUNTER — Telehealth: Payer: Medicaid Other | Admitting: Physician Assistant

## 2022-03-28 DIAGNOSIS — B9689 Other specified bacterial agents as the cause of diseases classified elsewhere: Secondary | ICD-10-CM | POA: Diagnosis not present

## 2022-03-28 DIAGNOSIS — J019 Acute sinusitis, unspecified: Secondary | ICD-10-CM

## 2022-03-28 MED ORDER — AMOXICILLIN-POT CLAVULANATE 875-125 MG PO TABS: 1.0000 | ORAL_TABLET | Freq: Two times a day (BID) | ORAL | 0 refills | Status: DC

## 2022-03-28 MED ORDER — BENZONATATE 100 MG PO CAPS: 100.0000 mg | ORAL_CAPSULE | Freq: Three times a day (TID) | ORAL | 0 refills | Status: DC | PRN

## 2022-03-28 MED ORDER — IPRATROPIUM BROMIDE 0.03 % NA SOLN: 2.0000 | Freq: Two times a day (BID) | NASAL | 0 refills | Status: DC

## 2022-03-28 NOTE — Patient Instructions (Signed)
Gary Crete, thank you for joining Mar Daring, PA-C for today's virtual visit.  While this provider is not your primary care provider (PCP), if your PCP is located in our provider database this encounter information will be shared with them immediately following your visit.   [facility] account gives you access to today's visit and all your visits, tests, and labs performed at [facility] " click here if you don't have a [facility] account or go to [email or web address]  Consent: (Patient) Gary Franco provided verbal consent for this virtual visit at the beginning of the encounter.  Current Medications:  Current Outpatient Medications:    amoxicillin-clavulanate (AUGMENTIN) 875-125 MG tablet, Take 1 tablet by mouth 2 (two) times daily., Disp: 14 tablet, Rfl: 0   benzonatate (TESSALON) 100 MG capsule, Take 1 capsule (100 mg total) by mouth 3 (three) times daily as needed., Disp: 30 capsule, Rfl: 0   ipratropium (ATROVENT) 0.03 % nasal spray, Place 2 sprays into both nostrils every 12 (twelve) hours., Disp: 30 mL, Rfl: 0   albuterol (VENTOLIN HFA) 108 (90 Base) MCG/ACT inhaler, Inhale 2 puffs into the lungs every 6 (six) hours as needed for wheezing or shortness of breath., Disp: , Rfl:    alprazolam (XANAX) 2 MG tablet, Take 2 mg by mouth daily as needed for sleep or anxiety., Disp: , Rfl:    atorvastatin (LIPITOR) 40 MG tablet, Take 80 mg by mouth at bedtime., Disp: , Rfl:    chlorhexidine (PERIDEX) 0.12 % solution, 7.5-15 mLs by Mouth Rinse route daily as needed (for irritation). With dental procedures, Disp: , Rfl:    cloNIDine (CATAPRES) 0.2 MG tablet, TAKE 1 TABLET BY MOUTH TWICE DAILY, Disp: 180 tablet, Rfl: 0   diclofenac Sodium (VOLTAREN) 1 % GEL, Apply 2 g topically 4 (four) times daily as needed (for pain- to affected aites)., Disp: , Rfl:    diltiazem (CARDIZEM CD) 240 MG 24 hr capsule, Take 1 capsule (240 mg total) by mouth daily., Disp: 90  capsule, Rfl: 3   ELIQUIS 5 MG TABS tablet, TAKE 1 TABLET(5 MG) BY MOUTH TWICE DAILY, Disp: 180 tablet, Rfl: 3   Ergocalciferol (VITAMIN D2 PO), Take 1 capsule by mouth daily at 12 noon. 50,000 IU, Disp: , Rfl:    ezetimibe (ZETIA) 10 MG tablet, Take 1 tablet (10 mg total) by mouth daily., Disp: 90 tablet, Rfl: 3   folic acid (FOLVITE) 1 MG tablet, Take 1 mg by mouth daily., Disp: , Rfl:    HUMALOG MIX 75/25 KWIKPEN (75-25) 100 UNIT/ML Kwikpen, Inject 10-20 Units into the skin See admin instructions. Inject 10-20 units into the skin three times a day before meals, per sliding scale, Disp: , Rfl:    hydrALAZINE (APRESOLINE) 25 MG tablet, TAKE 2 TABLETS(50 MG) BY MOUTH THREE TIMES DAILY, Disp: 180 tablet, Rfl: 2   lisinopril-hydrochlorothiazide (ZESTORETIC) 20-12.5 MG tablet, Take 1 tablet by mouth in the morning and at bedtime., Disp: 120 tablet, Rfl: 2   Oxycodone HCl 20 MG TABS, Take 20 mg by mouth See admin instructions. Take 20 mg by mouth up to 5 times a day as needed for pain, Disp: , Rfl:    OZEMPIC, 0.25 OR 0.5 MG/DOSE, 2 MG/1.5ML SOPN, Inject 0.5 mg as directed every Monday., Disp: , Rfl:    pantoprazole (PROTONIX) 40 MG tablet, Take 40 mg by mouth 2 (two) times daily before a meal., Disp: , Rfl: 0   phentermine (ADIPEX-P) 37.5 MG tablet, Take 37.5  mg by mouth daily as needed (AS DIRECTED)., Disp: , Rfl:    tadalafil (CIALIS) 10 MG tablet, Take 1 tablet (10 mg total) by mouth daily as needed for erectile dysfunction. Avoid concurrent use of nitroglycerin, Disp: 10 tablet, Rfl: 2   testosterone cypionate (DEPOTESTOSTERONE CYPIONATE) 200 MG/ML injection, Inject 200 mg into the muscle every Thursday., Disp: , Rfl:    Medications ordered in this encounter:  Meds ordered this encounter  Medications   ipratropium (ATROVENT) 0.03 % nasal spray    Sig: Place 2 sprays into both nostrils every 12 (twelve) hours.    Dispense:  30 mL    Refill:  0    Order Specific Question:   Supervising Provider     Answer:   Chase Picket [id number]   benzonatate (TESSALON) 100 MG capsule    Sig: Take 1 capsule (100 mg total) by mouth 3 (three) times daily as needed.    Dispense:  30 capsule    Refill:  0    Order Specific Question:   Supervising Provider    Answer:   Chase Picket [id number]   amoxicillin-clavulanate (AUGMENTIN) 875-125 MG tablet    Sig: Take 1 tablet by mouth 2 (two) times daily.    Dispense:  14 tablet    Refill:  0    Order Specific Question:   Supervising Provider    Answer:   Chase Picket [id number]     *If you need refills on other medications prior to your next appointment, please contact your pharmacy*  Follow-Up: Call back or seek an in-person evaluation if the symptoms worsen or if the condition fails to improve as anticipated.  [place] [phone]  Other Instructions  Sinus Infection, Adult A sinus infection, also called sinusitis, is inflammation of your sinuses. Sinuses are hollow spaces in the bones around your face. Your sinuses are located: Around your eyes. In the middle of your forehead. Behind your nose. In your cheekbones. Mucus normally drains out of your sinuses. When your nasal tissues become inflamed or swollen, mucus can become trapped or blocked. This allows bacteria, viruses, and fungi to grow, which leads to infection. Most infections of the sinuses are caused by a virus. A sinus infection can develop quickly. It can last for up to 4 weeks (acute) or for more than 12 weeks (chronic). A sinus infection often develops after a cold. What are the causes? This condition is caused by anything that creates swelling in the sinuses or stops mucus from draining. This includes: Allergies. Asthma. Infection from bacteria or viruses. Deformities or blockages in your nose or sinuses. Abnormal growths in the nose (nasal polyps). Pollutants, such as chemicals or irritants in the air. Infection from fungi. This is rare. What  increases the risk? You are more likely to develop this condition if you: Have a weak body defense system (immune system). Do a lot of swimming or diving. Overuse nasal sprays. Smoke. What are the signs or symptoms? The main symptoms of this condition are pain and a feeling of pressure around the affected sinuses. Other symptoms include: Stuffy nose or congestion that makes it difficult to breathe through your nose. Thick yellow or greenish drainage from your nose. Tenderness, swelling, and warmth over the affected sinuses. A cough that may get worse at night. Decreased sense of smell and taste. Extra mucus that collects in the throat or the back of the nose (postnasal drip) causing a sore throat or bad breath. Tiredness (  fatigue). Fever. How is this diagnosed? This condition is diagnosed based on: Your symptoms. Your medical history. A physical exam. Tests to find out if your condition is acute or chronic. This may include: Checking your nose for nasal polyps. Viewing your sinuses using a device that has a light (endoscope). Testing for allergies or bacteria. Imaging tests, such as an MRI or CT scan. In rare cases, a bone biopsy may be done to rule out more serious types of fungal sinus disease. How is this treated? Treatment for a sinus infection depends on the cause and whether your condition is chronic or acute. If caused by a virus, your symptoms should go away on their own within 10 days. You may be given medicines to relieve symptoms. They include: Medicines that shrink swollen nasal passages (decongestants). A spray that eases inflammation of the nostrils (topical intranasal corticosteroids). Rinses that help get rid of thick mucus in your nose (nasal saline washes). Medicines that treat allergies (antihistamines). Over-the-counter pain relievers. If caused by bacteria, your health care provider may recommend waiting to see if your symptoms improve. Most bacterial  infections will get better without antibiotic medicine. You may be given antibiotics if you have: A severe infection. A weak immune system. If caused by narrow nasal passages or nasal polyps, surgery may be needed. Follow these instructions at home: Medicines Take, use, or apply over-the-counter and prescription medicines only as told by your health care provider. These may include nasal sprays. If you were prescribed an antibiotic medicine, take it as told by your health care provider. Do not stop taking the antibiotic even if you start to feel better. Hydrate and humidify  Drink enough fluid to keep your urine pale yellow. Staying hydrated will help to thin your mucus. Use a cool mist humidifier to keep the humidity level in your home above 50%. Inhale steam for 10-15 minutes, 3-4 times a day, or as told by your health care provider. You can do this in the bathroom while a hot shower is running. Limit your exposure to cool or dry air. Rest Rest as much as possible. Sleep with your head raised (elevated). Make sure you get enough sleep each night. General instructions  Apply a warm, moist washcloth to your face 3-4 times a day or as told by your health care provider. This will help with discomfort. Use nasal saline washes as often as told by your health care provider. Wash your hands often with soap and water to reduce your exposure to germs. If soap and water are not available, use hand sanitizer. Do not smoke. Avoid being around people who are smoking (secondhand smoke). Keep all follow-up visits. This is important. Contact a health care provider if: You have a fever. Your symptoms get worse. Your symptoms do not improve within 10 days. Get help right away if: You have a severe headache. You have persistent vomiting. You have severe pain or swelling around your face or eyes. You have vision problems. You develop confusion. Your neck is stiff. You have trouble breathing. These  symptoms may be an emergency. Get help right away. Call 911. Do not wait to see if the symptoms will go away. Do not drive yourself to the hospital. Summary A sinus infection is soreness and inflammation of your sinuses. Sinuses are hollow spaces in the bones around your face. This condition is caused by nasal tissues that become inflamed or swollen. The swelling traps or blocks the flow of mucus. This allows bacteria, viruses, and  fungi to grow, which leads to infection. If you were prescribed an antibiotic medicine, take it as told by your health care provider. Do not stop taking the antibiotic even if you start to feel better. Keep all follow-up visits. This is important. This information is not intended to replace advice given to you by your health care provider. Make sure you discuss any questions you have with your health care provider. Document Revised: 02/14/2021 Document Reviewed: 02/14/2021 Elsevier Patient Education  [place].    If you have been instructed to have an in-person evaluation today at a local Urgent Care facility, please use the link below. It will take you to a list of all of our available [facility], including address, phone number and hours of operation. Please do not delay care.  [facility]  If you or a family member do not have a primary care provider, use the link below to schedule a visit and establish care. When you choose a [facility] primary care physician or advanced practice provider, you gain a long-term partner in health. Find a Primary Care Provider  Learn more about [facility]'s in-office and virtual care options: [facility]

## 2022-03-28 NOTE — Progress Notes (Signed)
Virtual Visit Consent   Gary Franco, you are scheduled for a virtual visit with a [facility] provider today. Just as with appointments in the office, your consent must be obtained to participate. Your consent will be active for this visit and any virtual visit you may have with one of our providers in the next 365 days. If you have a MyChart account, a copy of this consent can be sent to you electronically.  As this is a virtual visit, video technology does not allow for your provider to perform a traditional examination. This may limit your provider's ability to fully assess your condition. If your provider identifies any concerns that need to be evaluated in person or the need to arrange testing (such as labs, EKG, etc.), we will make arrangements to do so. Although advances in technology are sophisticated, we cannot ensure that it will always work on either your end or our end. If the connection with a video visit is poor, the visit may have to be switched to a telephone visit. With either a video or telephone visit, we are not always able to ensure that we have a secure connection.  By engaging in this virtual visit, you consent to the provision of healthcare and authorize for your insurance to be billed (if applicable) for the services provided during this visit. Depending on your insurance coverage, you may receive a charge related to this service.  I need to obtain your verbal consent now. Are you willing to proceed with your visit today? Rondey Broomfield has provided verbal consent on 03/28/2022 for a virtual visit (video or telephone). Margaretann Loveless, PA-C  Date: 03/28/2022 2:39 PM  Virtual Visit via Video Note   I, Margaretann Loveless, connected with  Elizabeth Glinsky  ([id number], 1965-12-27) on 03/28/22 at  2:30 PM EST by a video-enabled telemedicine application and verified that I am speaking with the correct person using two identifiers.  Location: Patient: Virtual Visit Location Patient:  Home Provider: Virtual Visit Location Provider: Home Office   I discussed the limitations of evaluation and management by telemedicine and the availability of in person appointments. The patient expressed understanding and agreed to proceed.    History of Present Illness: Joseh Mazmanian is a 57 y.o. who identifies as a male who was assigned male at birth, and is being seen today for flu-like symptoms.  HPI: URI  This is a new problem. The current episode started in the past 7 days. There has been no fever. Associated symptoms include congestion, coughing, rhinorrhea (and post nasal drainage) and sinus pain. Pertinent negatives include no diarrhea, ear pain, headaches, nausea, plugged ear sensation, sore throat or vomiting. Associated symptoms comments: Body aches from cough. Treatments tried: nyquil, dayquil, generic afrin. The treatment provided no relief.     Problems:  Patient Active Problem List   Diagnosis Date Noted   Pneumonia 07/29/2020   Abnormal CT scan 07/27/2020   Leukocytosis 07/27/2020   Elevated troponin    Hypertensive emergency    Long term (current) use of anticoagulants    Paroxysmal atrial fibrillation (HCC) 12/14/2019   Abnormal stress test 11/18/2018   Orthopnea 04/21/2018   OSA (obstructive sleep apnea) 04/21/2018   PTSD (post-traumatic stress disorder) 04/21/2018   Class 3 obesity with alveolar hypoventilation, serious comorbidity, and body mass index (BMI) of 50.0 to 59.9 in adult [facility]) 04/21/2018   Hypertensive urgency 06/17/2016   Morbid obesity (HCC) 06/17/2016   SOB (shortness of breath) 07/17/2012   Essential hypertension  07/17/2012   Diabetes mellitus (HCC) 07/17/2012   HLD (hyperlipidemia) 07/17/2012   Lower extremity edema 07/17/2012    Allergies:  Allergies  Allergen Reactions   Canagliflozin Diarrhea and Other (See Comments)    Brand name is Invokana   Milk-Related Compounds Diarrhea   Medications:  Current Outpatient Medications:     amoxicillin-clavulanate (AUGMENTIN) 875-125 MG tablet, Take 1 tablet by mouth 2 (two) times daily., Disp: 14 tablet, Rfl: 0   benzonatate (TESSALON) 100 MG capsule, Take 1 capsule (100 mg total) by mouth 3 (three) times daily as needed., Disp: 30 capsule, Rfl: 0   ipratropium (ATROVENT) 0.03 % nasal spray, Place 2 sprays into both nostrils every 12 (twelve) hours., Disp: 30 mL, Rfl: 0   albuterol (VENTOLIN HFA) 108 (90 Base) MCG/ACT inhaler, Inhale 2 puffs into the lungs every 6 (six) hours as needed for wheezing or shortness of breath., Disp: , Rfl:    alprazolam (XANAX) 2 MG tablet, Take 2 mg by mouth daily as needed for sleep or anxiety., Disp: , Rfl:    atorvastatin (LIPITOR) 40 MG tablet, Take 80 mg by mouth at bedtime., Disp: , Rfl:    chlorhexidine (PERIDEX) 0.12 % solution, 7.5-15 mLs by Mouth Rinse route daily as needed (for irritation). With dental procedures, Disp: , Rfl:    cloNIDine (CATAPRES) 0.2 MG tablet, TAKE 1 TABLET BY MOUTH TWICE DAILY, Disp: 180 tablet, Rfl: 0   diclofenac Sodium (VOLTAREN) 1 % GEL, Apply 2 g topically 4 (four) times daily as needed (for pain- to affected aites)., Disp: , Rfl:    diltiazem (CARDIZEM CD) 240 MG 24 hr capsule, Take 1 capsule (240 mg total) by mouth daily., Disp: 90 capsule, Rfl: 3   ELIQUIS 5 MG TABS tablet, TAKE 1 TABLET(5 MG) BY MOUTH TWICE DAILY, Disp: 180 tablet, Rfl: 3   Ergocalciferol (VITAMIN D2 PO), Take 1 capsule by mouth daily at 12 noon. 50,000 IU, Disp: , Rfl:    ezetimibe (ZETIA) 10 MG tablet, Take 1 tablet (10 mg total) by mouth daily., Disp: 90 tablet, Rfl: 3   folic acid (FOLVITE) 1 MG tablet, Take 1 mg by mouth daily., Disp: , Rfl:    HUMALOG MIX 75/25 KWIKPEN (75-25) 100 UNIT/ML Kwikpen, Inject 10-20 Units into the skin See admin instructions. Inject 10-20 units into the skin three times a day before meals, per sliding scale, Disp: , Rfl:    hydrALAZINE (APRESOLINE) 25 MG tablet, TAKE 2 TABLETS(50 MG) BY MOUTH THREE TIMES DAILY,  Disp: 180 tablet, Rfl: 2   lisinopril-hydrochlorothiazide (ZESTORETIC) 20-12.5 MG tablet, Take 1 tablet by mouth in the morning and at bedtime., Disp: 120 tablet, Rfl: 2   Oxycodone HCl 20 MG TABS, Take 20 mg by mouth See admin instructions. Take 20 mg by mouth up to 5 times a day as needed for pain, Disp: , Rfl:    OZEMPIC, 0.25 OR 0.5 MG/DOSE, 2 MG/1.5ML SOPN, Inject 0.5 mg as directed every Monday., Disp: , Rfl:    pantoprazole (PROTONIX) 40 MG tablet, Take 40 mg by mouth 2 (two) times daily before a meal., Disp: , Rfl: 0   phentermine (ADIPEX-P) 37.5 MG tablet, Take 37.5 mg by mouth daily as needed (AS DIRECTED)., Disp: , Rfl:    tadalafil (CIALIS) 10 MG tablet, Take 1 tablet (10 mg total) by mouth daily as needed for erectile dysfunction. Avoid concurrent use of nitroglycerin, Disp: 10 tablet, Rfl: 2   testosterone cypionate (DEPOTESTOSTERONE CYPIONATE) 200 MG/ML injection, Inject 200 mg into  the muscle every Thursday., Disp: , Rfl:   Observations/Objective: Patient is well-developed, well-nourished in no acute distress.  Resting comfortably at home.  Head is normocephalic, atraumatic.  No labored breathing.  Speech is clear and coherent with logical content.  Patient is alert and oriented at baseline.    Assessment and Plan: 1. Acute bacterial sinusitis - ipratropium (ATROVENT) 0.03 % nasal spray; Place 2 sprays into both nostrils every 12 (twelve) hours.  Dispense: 30 mL; Refill: 0 - benzonatate (TESSALON) 100 MG capsule; Take 1 capsule (100 mg total) by mouth 3 (three) times daily as needed.  Dispense: 30 capsule; Refill: 0 - amoxicillin-clavulanate (AUGMENTIN) 875-125 MG tablet; Take 1 tablet by mouth 2 (two) times daily.  Dispense: 14 tablet; Refill: 0  - Worsening symptoms that have not responded to OTC medications.  - Will give Augmentin, Ipratropium Bromide nasal spray, and Tessalon perles - Continue allergy medications.  - Steam and humidifier can help - Stay well hydrated  and get plenty of rest.  - Seek in person evaluation if no symptom improvement or if symptoms worsen   Follow Up Instructions: I discussed the assessment and treatment plan with the patient. The patient was provided an opportunity to ask questions and all were answered. The patient agreed with the plan and demonstrated an understanding of the instructions.  A copy of instructions were sent to the patient via MyChart unless otherwise noted below.    The patient was advised to call back or seek an in-person evaluation if the symptoms worsen or if the condition fails to improve as anticipated.  Time:  I spent 10 minutes with the patient via telehealth technology discussing the above problems/concerns.    Margaretann Loveless, PA-C

## 2022-05-08 ENCOUNTER — Ambulatory Visit (INDEPENDENT_AMBULATORY_CARE_PROVIDER_SITE_OTHER): Payer: Medicaid Other | Admitting: Ophthalmology

## 2022-05-08 ENCOUNTER — Encounter (INDEPENDENT_AMBULATORY_CARE_PROVIDER_SITE_OTHER): Payer: Self-pay | Admitting: Ophthalmology

## 2022-05-08 DIAGNOSIS — I10 Essential (primary) hypertension: Secondary | ICD-10-CM | POA: Diagnosis not present

## 2022-05-08 DIAGNOSIS — H25813 Combined forms of age-related cataract, bilateral: Secondary | ICD-10-CM | POA: Diagnosis not present

## 2022-05-08 DIAGNOSIS — E113213 Type 2 diabetes mellitus with mild nonproliferative diabetic retinopathy with macular edema, bilateral: Secondary | ICD-10-CM

## 2022-05-08 DIAGNOSIS — H35033 Hypertensive retinopathy, bilateral: Secondary | ICD-10-CM
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
## 2022-05-14 ENCOUNTER — Telehealth: Payer: Self-pay

## 2022-05-14 NOTE — Telephone Encounter (Signed)
Patient home blood pressure has been elevated. Notably patient has been under a lot of stress at home.   Systolic Blood Pressure mmHg -- 170.9 ([id number] - [id number]) Diastolic Blood Pressure mmHg -- 87.4 (68.0 - 104.0) Heart Rate bpm -- 70.5 (53.0 - 89.0)  05/13/22 7:36 AM [employer] TMB-... 168 / 84 mmHg 81 bpm  05/13/22 7:34 AM [employer] TMB-... 190 / 95 mmHg 72 bpm  05/12/22 8:41 AM [employer] TMB-... 169 / 89 mmHg 53 bpm  05/11/22 9:53 AM [employer] TMB-... 174 / 86 mmHg 64 bpm  05/11/22 9:23 AM [employer] TMB-... 182 / 104 mmHg 62 bpm  05/10/22 9:02 PM [employer] TMB-... 158 / 84 mmHg 73 bpm  05/10/22 9:28 AM [employer] TMB-... 181 / 90 mmHg 74 bpm  05/10/22 9:26 AM [employer] TMB-... 185 / 91 mmHg 75 bpm  05/09/22 10:44 AM [employer] TMB-... 169 / 89 mmHg 68 bpm  05/07/22 10:57 AM [employer] TMB-... 184 / 97 mmHg 65 Bpm  Current Meds  Medication Sig   cloNIDine (CATAPRES) 0.2 MG tablet TAKE 1 TABLET BY MOUTH TWICE DAILY   diltiazem (CARDIZEM CD) 240 MG 24 hr capsule Take 1 capsule (240 mg total) by mouth daily.   hydrALAZINE (APRESOLINE) 25 MG tablet TAKE 2 TABLETS(50 MG) BY MOUTH THREE TIMES DAILY   lisinopril-hydrochlorothiazide (ZESTORETIC) 20-12.5 MG tablet Take 1 tablet by mouth in the morning and at bedtime.

## 2022-05-18 ENCOUNTER — Other Ambulatory Visit: Payer: Self-pay

## 2022-05-18 DIAGNOSIS — I10 Essential (primary) hypertension: Secondary | ICD-10-CM

## 2022-05-18 MED ORDER — CLONIDINE HCL 0.3 MG PO TABS: 0.3000 mg | ORAL_TABLET | Freq: Two times a day (BID) | ORAL | 2 refills | Status: DC

## 2022-05-18 MED ORDER — HYDRALAZINE HCL 50 MG PO TABS: 150.0000 mg | ORAL_TABLET | Freq: Two times a day (BID) | ORAL | 2 refills | Status: DC

## 2022-05-18 NOTE — Progress Notes (Signed)
Per discussion with Dr. Virgina Jock - increasing clonidine from 0.'2mg'$  BID to 0.'3mg'$  BID and hydralazine from patient reported '25mg'$  3 tab ('75mg'$  total) BID to hydralazine '50mg'$  3 tab ('150mg'$  total) BID due to elevated home blood pressures.  05/16/22 7:19 AM  157 / 90 mmHg 66 bpm  05/14/22 9:28 AM  175 / 88 mmHg 74 bpm  05/14/22 9:27 AM  178 / 95 mmHg 73 bpm  05/13/22 7:36 AM  168 / 84 mmHg 81 bpm  05/13/22 7:34 AM  190 / 95 mmHg 72 bpm  05/12/22 8:41 AM  169 / 89 mmHg 53 bpm  05/11/22 9:53 AM  174 / 86 mmHg 64 bpm  05/11/22 9:23 AM  182 / 104 mmHg 62 bpm  05/10/22 9:02 PM  158 / 84 mmHg 73 bpm  05/10/22 9:28 AM  181 / 90 mmHg 74 bpm  05/10/22 9:26 AM  185 / 91 mmHg 75 bpm  05/09/22 10:44 AM  169 / 89 mmHg 68 bpm  05/07/22 10:57 AM  184 / 97 mmHg 65 bpm  05/05/22 11:07 AM  182 / 91 mmHg 62 bpm  05/05/22 11:06 AM  183 / 92 mmHg 64 bpm  05/05/22 12:23 AM  140 / 74 mmHg 86 bpm

## 2022-05-22 ENCOUNTER — Other Ambulatory Visit: Payer: Self-pay | Admitting: Internal Medicine

## 2022-05-23 LAB — CBC
HCT: 39.5 % (ref 38.5–50.0)
Hemoglobin: 12.2 g/dL — ABNORMAL LOW (ref 13.2–17.1)
MCH: 25.1 pg — ABNORMAL LOW (ref 27.0–33.0)
MCHC: 30.9 g/dL — ABNORMAL LOW (ref 32.0–36.0)
MCV: 81.1 fL (ref 80.0–100.0)
MPV: 9.4 fL (ref 7.5–12.5)
Platelets: 438 10*3/uL — ABNORMAL HIGH (ref 140–400)
RBC: 4.87 10*6/uL (ref 4.20–5.80)
RDW: 16.6 % — ABNORMAL HIGH (ref 11.0–15.0)
WBC: 10.8 10*3/uL (ref 3.8–10.8)

## 2022-05-23 LAB — TSH: TSH: 2.59 mIU/L (ref 0.40–4.50)

## 2022-05-23 LAB — LIPID PANEL
Cholesterol: 101 mg/dL (ref <200)
HDL: 36 mg/dL — ABNORMAL LOW (ref >=40)
LDL Cholesterol (Calc): 51 mg/dL (calc)
Non-HDL Cholesterol (Calc): 65 mg/dL (calc) (ref <130)
Total CHOL/HDL Ratio: 2.8 (calc) (ref <5.0)
Triglycerides: 48 mg/dL (ref <150)

## 2022-05-23 LAB — COMPLETE METABOLIC PANEL WITH GFR
AG Ratio: 1.3 (calc) (ref 1.0–2.5)
ALT: 13 U/L (ref 9–46)
AST: 14 U/L (ref 10–35)
Albumin: 3.7 g/dL (ref 3.6–5.1)
Alkaline phosphatase (APISO): 79 U/L (ref 35–144)
BUN/Creatinine Ratio: 16 (calc) (ref 6–22)
BUN: 22 mg/dL (ref 7–25)
CO2: 24 mmol/L (ref 20–32)
Calcium: 9.4 mg/dL (ref 8.6–10.3)
Chloride: 105 mmol/L (ref 98–110)
Creat: 1.37 mg/dL — ABNORMAL HIGH (ref 0.70–1.30)
Globulin: 2.9 g/dL (calc) (ref 1.9–3.7)
Glucose, Bld: 179 mg/dL — ABNORMAL HIGH (ref 65–99)
Potassium: 4.3 mmol/L (ref 3.5–5.3)
Sodium: 141 mmol/L (ref 135–146)
Total Bilirubin: 0.3 mg/dL (ref 0.2–1.2)
Total Protein: 6.6 g/dL (ref 6.1–8.1)
eGFR: 60 mL/min/{1.73_m2} (ref >=60)

## 2022-05-23 LAB — VITAMIN D 25 HYDROXY (VIT D DEFICIENCY, FRACTURES): Vit D, 25-Hydroxy: 88 ng/mL (ref 30–100)

## 2022-06-07 ENCOUNTER — Telehealth: Payer: Self-pay

## 2022-06-07 ENCOUNTER — Ambulatory Visit: Payer: Medicaid Other | Admitting: Cardiology

## 2022-06-07 ENCOUNTER — Encounter: Payer: Self-pay | Admitting: Cardiology

## 2022-06-07 VITALS — BP 152/75 | HR 69 | Resp 16 | Ht 72.0 in | Wt 335.0 lb

## 2022-06-07 DIAGNOSIS — I480 Paroxysmal atrial fibrillation: Secondary | ICD-10-CM

## 2022-06-07 DIAGNOSIS — I10 Essential (primary) hypertension: Secondary | ICD-10-CM

## 2022-06-07 DIAGNOSIS — I2510 Atherosclerotic heart disease of native coronary artery without angina pectoris: Secondary | ICD-10-CM
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
## 2022-06-07 NOTE — Telephone Encounter (Signed)
Patient home blood pressure has improved since medication changes at the end of February 2024.  Clonidine 0.'2mg'$  BID was increased to 0.'3mg'$  BID and hydralazine '75mg'$  BID ('25mg'$  TID) was increased to '150mg'$  BID.  Prior to above medication changes (0000000) Systolic Blood Pressure mmHg -- 169.9 ([id number] - [id number]) Diastolic Blood Pressure mmHg -- 86.6 (65.0 - 104.0) Heart Rate bpm -- 72.3 (53.0 - 89.0)  After above medication changes ([id number]) Systolic Blood Pressure mmHg -- 155.0 ([id number] - [id number]) Diastolic Blood Pressure mmHg -- 79.8 (62.0 - 101.0) Heart Rate bpm -- 69.2 (59.0 - 77.0)  06/06/22 9:01 AM -... 151 / 81 mmHg 68 bpm  06/06/22 9:00 AM -... 164 / 90 mmHg 67 bpm  06/05/22 6:36 AM -... 162 / 79 mmHg 71 bpm  06/04/22 10:14 PM -... 127 / 71 mmHg 77 bpm  06/04/22 10:12 PM -... 152 / 90 mmHg 76 bpm  06/04/22 11:09 AM -... 148 / 80 mmHg 68 bpm  06/04/22 11:07 AM -... 132 / 63 mmHg 71 bpm  05/30/22 11:24 AM -... 160 / 72 mmHg 71 bpm  05/30/22 11:23 AM -... 175 / 90 mmHg 66 bpm

## 2022-07-24 ENCOUNTER — Encounter (INDEPENDENT_AMBULATORY_CARE_PROVIDER_SITE_OTHER): Payer: Medicaid Other | Admitting: Ophthalmology

## 2022-07-24 DIAGNOSIS — I10 Essential (primary) hypertension: Secondary | ICD-10-CM

## 2022-07-24 DIAGNOSIS — H25813 Combined forms of age-related cataract, bilateral: Secondary | ICD-10-CM

## 2022-07-24 DIAGNOSIS — H35033 Hypertensive retinopathy, bilateral: Secondary | ICD-10-CM

## 2022-07-24 DIAGNOSIS — E113213 Type 2 diabetes mellitus with mild nonproliferative diabetic retinopathy with macular edema, bilateral: Secondary | ICD-10-CM

## 2022-08-21 ENCOUNTER — Other Ambulatory Visit: Payer: Self-pay | Admitting: Internal Medicine

## 2022-08-22 ENCOUNTER — Other Ambulatory Visit: Payer: Self-pay | Admitting: Cardiology

## 2022-08-22 DIAGNOSIS — I10 Essential (primary) hypertension: Secondary | ICD-10-CM

## 2022-08-24 LAB — URINE CULTURE
MICRO NUMBER:: 15008554
SPECIMEN QUALITY:: ADEQUATE

## 2022-09-10 ENCOUNTER — Encounter (INDEPENDENT_AMBULATORY_CARE_PROVIDER_SITE_OTHER): Payer: Self-pay

## 2022-09-10 ENCOUNTER — Encounter (INDEPENDENT_AMBULATORY_CARE_PROVIDER_SITE_OTHER): Payer: Medicaid Other | Admitting: Ophthalmology

## 2022-09-10 DIAGNOSIS — E113213 Type 2 diabetes mellitus with mild nonproliferative diabetic retinopathy with macular edema, bilateral: Secondary | ICD-10-CM

## 2022-09-10 DIAGNOSIS — H35033 Hypertensive retinopathy, bilateral: Secondary | ICD-10-CM

## 2022-09-10 DIAGNOSIS — I10 Essential (primary) hypertension: Secondary | ICD-10-CM

## 2022-09-10 DIAGNOSIS — H25813 Combined forms of age-related cataract, bilateral: Secondary | ICD-10-CM

## 2022-11-07 ENCOUNTER — Encounter (INDEPENDENT_AMBULATORY_CARE_PROVIDER_SITE_OTHER): Payer: Self-pay

## 2022-11-07 ENCOUNTER — Encounter (INDEPENDENT_AMBULATORY_CARE_PROVIDER_SITE_OTHER): Payer: Medicaid Other | Admitting: Ophthalmology

## 2022-11-07 DIAGNOSIS — I10 Essential (primary) hypertension: Secondary | ICD-10-CM

## 2022-11-07 DIAGNOSIS — H35033 Hypertensive retinopathy, bilateral: Secondary | ICD-10-CM

## 2022-11-07 DIAGNOSIS — E113213 Type 2 diabetes mellitus with mild nonproliferative diabetic retinopathy with macular edema, bilateral: Secondary | ICD-10-CM

## 2022-11-07 DIAGNOSIS — H25813 Combined forms of age-related cataract, bilateral: Secondary | ICD-10-CM

## 2022-11-15 ENCOUNTER — Other Ambulatory Visit: Payer: Self-pay | Admitting: Cardiology

## 2022-11-15 DIAGNOSIS — I10 Essential (primary) hypertension: Secondary | ICD-10-CM

## 2022-12-06 ENCOUNTER — Ambulatory Visit: Payer: Medicaid Other | Admitting: Cardiology

## 2022-12-08 ENCOUNTER — Other Ambulatory Visit: Payer: Self-pay | Admitting: Cardiology

## 2022-12-08 DIAGNOSIS — I10 Essential (primary) hypertension: Secondary | ICD-10-CM

## 2022-12-08 DIAGNOSIS — I480 Paroxysmal atrial fibrillation: Secondary | ICD-10-CM

## 2022-12-16 ENCOUNTER — Emergency Department (HOSPITAL_COMMUNITY): Payer: Medicaid Other

## 2022-12-16 ENCOUNTER — Inpatient Hospital Stay (HOSPITAL_COMMUNITY)
Admission: EM | Admit: 2022-12-16 | Discharge: 2022-12-18 | DRG: 291 | Disposition: A | Discharge status: Home / Self Care | Payer: Medicaid Other | Attending: Internal Medicine | Admitting: Internal Medicine

## 2022-12-16 ENCOUNTER — Other Ambulatory Visit (HOSPITAL_COMMUNITY): Payer: Medicaid Other

## 2022-12-16 ENCOUNTER — Other Ambulatory Visit: Payer: Self-pay

## 2022-12-16 ENCOUNTER — Encounter (HOSPITAL_COMMUNITY): Payer: Self-pay

## 2022-12-16 ENCOUNTER — Emergency Department (HOSPITAL_BASED_OUTPATIENT_CLINIC_OR_DEPARTMENT_OTHER): Payer: Medicaid Other

## 2022-12-16 DIAGNOSIS — N1831 Chronic kidney disease, stage 3a: Secondary | ICD-10-CM | POA: Diagnosis present

## 2022-12-16 DIAGNOSIS — Z9081 Acquired absence of spleen: Secondary | ICD-10-CM

## 2022-12-16 DIAGNOSIS — I160 Hypertensive urgency: Secondary | ICD-10-CM | POA: Diagnosis present

## 2022-12-16 DIAGNOSIS — E785 Hyperlipidemia, unspecified: Secondary | ICD-10-CM | POA: Diagnosis not present

## 2022-12-16 DIAGNOSIS — R0602 Shortness of breath: Secondary | ICD-10-CM

## 2022-12-16 DIAGNOSIS — M549 Dorsalgia, unspecified: Secondary | ICD-10-CM | POA: Diagnosis present

## 2022-12-16 DIAGNOSIS — Z833 Family history of diabetes mellitus: Secondary | ICD-10-CM

## 2022-12-16 DIAGNOSIS — Z7985 Long-term (current) use of injectable non-insulin antidiabetic drugs: Secondary | ICD-10-CM

## 2022-12-16 DIAGNOSIS — E1142 Type 2 diabetes mellitus with diabetic polyneuropathy: Secondary | ICD-10-CM

## 2022-12-16 DIAGNOSIS — Z9049 Acquired absence of other specified parts of digestive tract: Secondary | ICD-10-CM

## 2022-12-16 DIAGNOSIS — Z91011 Allergy to milk products: Secondary | ICD-10-CM

## 2022-12-16 DIAGNOSIS — E662 Morbid (severe) obesity with alveolar hypoventilation: Secondary | ICD-10-CM

## 2022-12-16 DIAGNOSIS — I2489 Other forms of acute ischemic heart disease: Secondary | ICD-10-CM | POA: Diagnosis present

## 2022-12-16 DIAGNOSIS — R600 Localized edema: Secondary | ICD-10-CM | POA: Diagnosis present

## 2022-12-16 DIAGNOSIS — F4310 Post-traumatic stress disorder, unspecified: Secondary | ICD-10-CM | POA: Diagnosis present

## 2022-12-16 DIAGNOSIS — Z7901 Long term (current) use of anticoagulants: Secondary | ICD-10-CM

## 2022-12-16 DIAGNOSIS — Z91199 Patient's noncompliance with other medical treatment and regimen due to unspecified reason: Secondary | ICD-10-CM

## 2022-12-16 DIAGNOSIS — I480 Paroxysmal atrial fibrillation: Secondary | ICD-10-CM | POA: Diagnosis present

## 2022-12-16 DIAGNOSIS — G4733 Obstructive sleep apnea (adult) (pediatric): Secondary | ICD-10-CM | POA: Diagnosis present

## 2022-12-16 DIAGNOSIS — G8929 Other chronic pain: Secondary | ICD-10-CM | POA: Diagnosis present

## 2022-12-16 DIAGNOSIS — F419 Anxiety disorder, unspecified: Secondary | ICD-10-CM | POA: Diagnosis present

## 2022-12-16 DIAGNOSIS — I5033 Acute on chronic diastolic (congestive) heart failure: Secondary | ICD-10-CM | POA: Diagnosis present

## 2022-12-16 DIAGNOSIS — Z79899 Other long term (current) drug therapy: Secondary | ICD-10-CM

## 2022-12-16 DIAGNOSIS — K219 Gastro-esophageal reflux disease without esophagitis: Secondary | ICD-10-CM | POA: Diagnosis present

## 2022-12-16 DIAGNOSIS — I161 Hypertensive emergency: Secondary | ICD-10-CM | POA: Diagnosis present

## 2022-12-16 DIAGNOSIS — I2510 Atherosclerotic heart disease of native coronary artery without angina pectoris: Secondary | ICD-10-CM | POA: Diagnosis present

## 2022-12-16 DIAGNOSIS — M7989 Other specified soft tissue disorders: Secondary | ICD-10-CM

## 2022-12-16 DIAGNOSIS — R7989 Other specified abnormal findings of blood chemistry: Secondary | ICD-10-CM | POA: Diagnosis present

## 2022-12-16 DIAGNOSIS — I10 Essential (primary) hypertension: Secondary | ICD-10-CM | POA: Diagnosis present

## 2022-12-16 DIAGNOSIS — I130 Hypertensive heart and chronic kidney disease with heart failure and stage 1 through stage 4 chronic kidney disease, or unspecified chronic kidney disease: Principal | ICD-10-CM | POA: Diagnosis present

## 2022-12-16 DIAGNOSIS — E1165 Type 2 diabetes mellitus with hyperglycemia: Secondary | ICD-10-CM | POA: Diagnosis present

## 2022-12-16 DIAGNOSIS — Z8249 Family history of ischemic heart disease and other diseases of the circulatory system: Secondary | ICD-10-CM

## 2022-12-16 DIAGNOSIS — Z1152 Encounter for screening for COVID-19: Secondary | ICD-10-CM

## 2022-12-16 DIAGNOSIS — Z8349 Family history of other endocrine, nutritional and metabolic diseases: Secondary | ICD-10-CM

## 2022-12-16 DIAGNOSIS — Z794 Long term (current) use of insulin: Secondary | ICD-10-CM

## 2022-12-16 DIAGNOSIS — J810 Acute pulmonary edema: Principal | ICD-10-CM

## 2022-12-16 DIAGNOSIS — I509 Heart failure, unspecified: Secondary | ICD-10-CM

## 2022-12-16 DIAGNOSIS — E1122 Type 2 diabetes mellitus with diabetic chronic kidney disease: Secondary | ICD-10-CM | POA: Diagnosis present

## 2022-12-16 DIAGNOSIS — Z6841 Body Mass Index (BMI) 40.0 and over, adult: Secondary | ICD-10-CM

## 2022-12-16 DIAGNOSIS — E119 Type 2 diabetes mellitus without complications: Secondary | ICD-10-CM

## 2022-12-16 LAB — BRAIN NATRIURETIC PEPTIDE: B Natriuretic Peptide: 63.4 pg/mL (ref 0.0–100.0)

## 2022-12-16 LAB — MRSA NEXT GEN BY PCR, NASAL: MRSA by PCR Next Gen: NOT DETECTED

## 2022-12-16 LAB — COMPREHENSIVE METABOLIC PANEL
ALT: 22 U/L (ref 0–44)
AST: 18 U/L (ref 15–41)
Albumin: 3.1 g/dL — ABNORMAL LOW (ref 3.5–5.0)
Alkaline Phosphatase: 80 U/L (ref 38–126)
Anion gap: 9 (ref 5–15)
BUN: 18 mg/dL (ref 6–20)
CO2: 25 mmol/L (ref 22–32)
Calcium: 8.3 mg/dL — ABNORMAL LOW (ref 8.9–10.3)
Chloride: 102 mmol/L (ref 98–111)
Creatinine, Ser: 1.4 mg/dL — ABNORMAL HIGH (ref 0.61–1.24)
GFR, Estimated: 58 mL/min — ABNORMAL LOW (ref >60)
Glucose, Bld: 283 mg/dL — ABNORMAL HIGH (ref 70–99)
Potassium: 4.2 mmol/L (ref 3.5–5.1)
Sodium: 136 mmol/L (ref 135–145)
Total Bilirubin: 0.4 mg/dL (ref 0.3–1.2)
Total Protein: 6.7 g/dL (ref 6.5–8.1)

## 2022-12-16 LAB — I-STAT VENOUS BLOOD GAS, ED
Acid-Base Excess: 6 mmol/L — ABNORMAL HIGH (ref 0.0–2.0)
Bicarbonate: 29.8 mmol/L — ABNORMAL HIGH (ref 20.0–28.0)
Calcium, Ion: 1.09 mmol/L — ABNORMAL LOW (ref 1.15–1.40)
HCT: 41 % (ref 39.0–52.0)
Hemoglobin: 13.9 g/dL (ref 13.0–17.0)
O2 Saturation: 93 %
Potassium: 4.2 mmol/L (ref 3.5–5.1)
Sodium: 137 mmol/L (ref 135–145)
TCO2: 31 mmol/L (ref 22–32)
pCO2, Ven: 38.4 mmHg — ABNORMAL LOW (ref 44–60)
pH, Ven: 7.498 — ABNORMAL HIGH (ref 7.25–7.43)
pO2, Ven: 62 mmHg — ABNORMAL HIGH (ref 32–45)

## 2022-12-16 LAB — RESP PANEL BY RT-PCR (RSV, FLU A&B, COVID)  RVPGX2
Influenza A by PCR: NEGATIVE
Influenza B by PCR: NEGATIVE
Resp Syncytial Virus by PCR: NEGATIVE
SARS Coronavirus 2 by RT PCR: NEGATIVE

## 2022-12-16 LAB — CBC
HCT: 38.2 % — ABNORMAL LOW (ref 39.0–52.0)
Hemoglobin: 11.3 g/dL — ABNORMAL LOW (ref 13.0–17.0)
MCH: 22.3 pg — ABNORMAL LOW (ref 26.0–34.0)
MCHC: 29.6 g/dL — ABNORMAL LOW (ref 30.0–36.0)
MCV: 75.5 fL — ABNORMAL LOW (ref 80.0–100.0)
Platelets: 460 10*3/uL — ABNORMAL HIGH (ref 150–400)
RBC: 5.06 MIL/uL (ref 4.22–5.81)
RDW: 21.3 % — ABNORMAL HIGH (ref 11.5–15.5)
WBC: 16.6 10*3/uL — ABNORMAL HIGH (ref 4.0–10.5)
nRBC: 0.2 % (ref 0.0–0.2)

## 2022-12-16 LAB — HEMOGLOBIN A1C
Hgb A1c MFr Bld: 12 % — ABNORMAL HIGH (ref 4.8–5.6)
Mean Plasma Glucose: 297.7 mg/dL

## 2022-12-16 LAB — GLUCOSE, CAPILLARY
Glucose-Capillary: 266 mg/dL — ABNORMAL HIGH (ref 70–99)
Glucose-Capillary: 253 mg/dL — ABNORMAL HIGH (ref 70–99)

## 2022-12-16 LAB — CBG MONITORING, ED: Glucose-Capillary: 260 mg/dL — ABNORMAL HIGH (ref 70–99)

## 2022-12-16 LAB — HIV ANTIBODY (ROUTINE TESTING W REFLEX): HIV Screen 4th Generation wRfx: NONREACTIVE

## 2022-12-16 LAB — TROPONIN I (HIGH SENSITIVITY)
Troponin I (High Sensitivity): 24 ng/L — ABNORMAL HIGH (ref <18)
Troponin I (High Sensitivity): 19 ng/L — ABNORMAL HIGH (ref <18)
Troponin I (High Sensitivity): 27 ng/L — ABNORMAL HIGH (ref <18)

## 2022-12-16 MED ORDER — ONDANSETRON HCL 4 MG/2ML IJ SOLN: 4.0000 mg | Freq: Four times a day (QID) | INTRAMUSCULAR | Status: DC | PRN

## 2022-12-16 MED ORDER — HYDRALAZINE HCL 25 MG PO TABS
50.0000 mg | ORAL_TABLET | Freq: Three times a day (TID) | ORAL | Status: DC
  Filled 2022-12-16: qty 2

## 2022-12-16 MED ORDER — ALPRAZOLAM 0.5 MG PO TABS
2.0000 mg | ORAL_TABLET | Freq: Every day | ORAL | Status: DC | PRN
  Administered 2022-12-16 – 2022-12-17 (×2): 2 mg via ORAL
  Filled 2022-12-16 (×2): qty 4

## 2022-12-16 MED ORDER — OXYCODONE HCL 5 MG PO TABS: 5.0000 mg | ORAL_TABLET | Freq: Four times a day (QID) | ORAL | Status: DC | PRN

## 2022-12-16 MED ORDER — HYDRALAZINE HCL 20 MG/ML IJ SOLN: 10.0000 mg | Freq: Four times a day (QID) | INTRAMUSCULAR | Status: DC | PRN

## 2022-12-16 MED ORDER — IOHEXOL 350 MG/ML SOLN
75.0000 mL | Freq: Once | INTRAVENOUS | Status: AC | PRN
  Administered 2022-12-16: 75 mL via INTRAVENOUS

## 2022-12-16 MED ORDER — SODIUM CHLORIDE 0.9% FLUSH
3.0000 mL | Freq: Two times a day (BID) | INTRAVENOUS | Status: DC
  Administered 2022-12-16 – 2022-12-18 (×5): 3 mL via INTRAVENOUS

## 2022-12-16 MED ORDER — PANTOPRAZOLE SODIUM 40 MG PO TBEC
40.0000 mg | DELAYED_RELEASE_TABLET | Freq: Two times a day (BID) | ORAL | Status: DC
  Administered 2022-12-16 – 2022-12-18 (×4): 40 mg via ORAL
  Filled 2022-12-16 (×4): qty 1

## 2022-12-16 MED ORDER — TRAMADOL HCL 50 MG PO TABS: 50.0000 mg | ORAL_TABLET | Freq: Four times a day (QID) | ORAL | Status: DC | PRN

## 2022-12-16 MED ORDER — ACETAMINOPHEN 325 MG PO TABS: 650.0000 mg | ORAL_TABLET | ORAL | Status: DC | PRN

## 2022-12-16 MED ORDER — SODIUM CHLORIDE 0.9 % IV SOLN: 250.0000 mL | INTRAVENOUS | Status: DC | PRN

## 2022-12-16 MED ORDER — DILTIAZEM HCL ER COATED BEADS 240 MG PO CP24
240.0000 mg | ORAL_CAPSULE | Freq: Every day | ORAL | Status: DC
  Administered 2022-12-16 – 2022-12-18 (×3): 240 mg via ORAL
  Filled 2022-12-16: qty 2
  Filled 2022-12-16: qty 1
  Filled 2022-12-16: qty 2
  Filled 2022-12-16 (×2): qty 1
  Filled 2022-12-16: qty 2

## 2022-12-16 MED ORDER — INSULIN LISPRO PROT & LISPRO (50-50 MIX) 100 UNIT/ML ~~LOC~~ SUSP
20.0000 [IU] | Freq: Two times a day (BID) | SUBCUTANEOUS | Status: DC
  Filled 2022-12-16: qty 10

## 2022-12-16 MED ORDER — CLONIDINE HCL 0.1 MG PO TABS
0.3000 mg | ORAL_TABLET | Freq: Three times a day (TID) | ORAL | Status: DC
  Administered 2022-12-16 – 2022-12-18 (×5): 0.3 mg via ORAL
  Filled 2022-12-16 (×5): qty 3

## 2022-12-16 MED ORDER — HYDRALAZINE HCL 50 MG PO TABS
50.0000 mg | ORAL_TABLET | Freq: Three times a day (TID) | ORAL | Status: DC
  Administered 2022-12-16 – 2022-12-18 (×5): 50 mg via ORAL
  Filled 2022-12-16 (×5): qty 1

## 2022-12-16 MED ORDER — LISINOPRIL 10 MG PO TABS
5.0000 mg | ORAL_TABLET | Freq: Every day | ORAL | Status: DC
  Administered 2022-12-16 – 2022-12-17 (×2): 5 mg via ORAL
  Filled 2022-12-16 (×3): qty 1

## 2022-12-16 MED ORDER — ASPIRIN 81 MG PO TBEC
81.0000 mg | DELAYED_RELEASE_TABLET | Freq: Every day | ORAL | Status: DC
  Administered 2022-12-16 – 2022-12-18 (×3): 81 mg via ORAL
  Filled 2022-12-16 (×3): qty 1

## 2022-12-16 MED ORDER — INSULIN ASPART 100 UNIT/ML IJ SOLN
0.0000 [IU] | Freq: Three times a day (TID) | INTRAMUSCULAR | Status: DC
  Administered 2022-12-16 – 2022-12-17 (×2): 8 [IU] via SUBCUTANEOUS
  Administered 2022-12-17: 11 [IU] via SUBCUTANEOUS
  Administered 2022-12-17: 5 [IU] via SUBCUTANEOUS
  Administered 2022-12-18: 11 [IU] via SUBCUTANEOUS
  Administered 2022-12-18: 3 [IU] via SUBCUTANEOUS

## 2022-12-16 MED ORDER — SODIUM CHLORIDE 0.9% FLUSH: 3.0000 mL | INTRAVENOUS | Status: DC | PRN

## 2022-12-16 MED ORDER — FOLIC ACID 1 MG PO TABS
1.0000 mg | ORAL_TABLET | Freq: Every day | ORAL | Status: DC
  Administered 2022-12-16 – 2022-12-18 (×3): 1 mg via ORAL
  Filled 2022-12-16 (×3): qty 1

## 2022-12-16 MED ORDER — CLONIDINE HCL 0.2 MG PO TABS
0.3000 mg | ORAL_TABLET | Freq: Three times a day (TID) | ORAL | Status: DC
  Filled 2022-12-16: qty 1

## 2022-12-16 MED ORDER — FUROSEMIDE 10 MG/ML IJ SOLN
INTRAMUSCULAR | Status: AC
  Filled 2022-12-16: qty 2

## 2022-12-16 MED ORDER — APIXABAN 5 MG PO TABS
5.0000 mg | ORAL_TABLET | Freq: Two times a day (BID) | ORAL | Status: DC
  Administered 2022-12-16 – 2022-12-18 (×4): 5 mg via ORAL
  Filled 2022-12-16 (×4): qty 1

## 2022-12-16 MED ORDER — EZETIMIBE 10 MG PO TABS
10.0000 mg | ORAL_TABLET | Freq: Every day | ORAL | Status: DC
  Administered 2022-12-16 – 2022-12-18 (×3): 10 mg via ORAL
  Filled 2022-12-16 (×3): qty 1

## 2022-12-16 MED ORDER — APIXABAN 5 MG PO TABS: 5.0000 mg | ORAL_TABLET | Freq: Two times a day (BID) | ORAL | Status: DC

## 2022-12-16 MED ORDER — FUROSEMIDE 10 MG/ML IJ SOLN
20.0000 mg | Freq: Two times a day (BID) | INTRAMUSCULAR | Status: DC
  Administered 2022-12-16 – 2022-12-17 (×2): 20 mg via INTRAVENOUS
  Filled 2022-12-16 (×2): qty 2

## 2022-12-16 MED ORDER — FUROSEMIDE 10 MG/ML IJ SOLN
20.0000 mg | Freq: Once | INTRAMUSCULAR | Status: AC
  Administered 2022-12-16: 20 mg via INTRAVENOUS
  Filled 2022-12-16: qty 2

## 2022-12-16 MED ORDER — IPRATROPIUM-ALBUTEROL 0.5-2.5 (3) MG/3ML IN SOLN: 3.0000 mL | RESPIRATORY_TRACT | Status: DC | PRN

## 2022-12-16 MED ORDER — ATORVASTATIN CALCIUM 80 MG PO TABS
80.0000 mg | ORAL_TABLET | Freq: Every day | ORAL | Status: DC
  Administered 2022-12-16 – 2022-12-17 (×2): 80 mg via ORAL
  Filled 2022-12-16 (×2): qty 1

## 2022-12-16 MED ORDER — NITROGLYCERIN IN D5W 200-5 MCG/ML-% IV SOLN
0.0000 ug/min | INTRAVENOUS | Status: DC
  Administered 2022-12-16: 20 ug/min via INTRAVENOUS
  Filled 2022-12-16: qty 250

## 2022-12-16 MED ORDER — INSULIN ASPART PROT & ASPART (70-30 MIX) 100 UNIT/ML ~~LOC~~ SUSP
20.0000 [IU] | Freq: Two times a day (BID) | SUBCUTANEOUS | Status: DC
  Administered 2022-12-16 – 2022-12-18 (×4): 20 [IU] via SUBCUTANEOUS
  Filled 2022-12-16: qty 10
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
## 2022-12-16 NOTE — Progress Notes (Signed)
RT attempted ABG x2 and was unsuccessful. Notified bedside RN

## 2022-12-16 NOTE — Plan of Care (Signed)

## 2022-12-16 NOTE — ED Triage Notes (Signed)
Pt c/o SOB started last night. Pt c/o right leg edema started 2 days ago. Pt has 2+ right leg edema.
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
## 2022-12-16 NOTE — ED Notes (Signed)
Pt returned from CT ad pt reporting worsening SOB and cough. Dr Jearld Fenton made aware and at bedside reevaluating pt. Pt remains on monitor.

## 2022-12-16 NOTE — ED Notes (Signed)
Pt transported to unit via stretcher. Pt a&ox4,vss,nad.

## 2022-12-16 NOTE — ED Notes (Signed)
US at bedside

## 2022-12-16 NOTE — Progress Notes (Signed)
VASCULAR LAB    Right lower extremity venous duplex has been performed.  See CV proc for preliminary results.   Messaged negative results to Dr. Jearld Fenton via secure chat   Sherren Kerns, RVT 12/16/2022, 12:32 PM

## 2022-12-17 ENCOUNTER — Ambulatory Visit (HOSPITAL_COMMUNITY): Payer: Medicaid Other

## 2022-12-17 DIAGNOSIS — Z8249 Family history of ischemic heart disease and other diseases of the circulatory system: Secondary | ICD-10-CM | POA: Diagnosis not present

## 2022-12-17 DIAGNOSIS — N1831 Chronic kidney disease, stage 3a: Secondary | ICD-10-CM | POA: Diagnosis present

## 2022-12-17 DIAGNOSIS — I5031 Acute diastolic (congestive) heart failure: Secondary | ICD-10-CM | POA: Diagnosis not present

## 2022-12-17 DIAGNOSIS — I130 Hypertensive heart and chronic kidney disease with heart failure and stage 1 through stage 4 chronic kidney disease, or unspecified chronic kidney disease: Secondary | ICD-10-CM | POA: Diagnosis present

## 2022-12-17 DIAGNOSIS — Z6841 Body Mass Index (BMI) 40.0 and over, adult: Secondary | ICD-10-CM | POA: Diagnosis not present

## 2022-12-17 DIAGNOSIS — I509 Heart failure, unspecified: Secondary | ICD-10-CM | POA: Diagnosis not present

## 2022-12-17 DIAGNOSIS — R0602 Shortness of breath: Secondary | ICD-10-CM | POA: Diagnosis present

## 2022-12-17 DIAGNOSIS — I161 Hypertensive emergency: Secondary | ICD-10-CM | POA: Diagnosis present

## 2022-12-17 DIAGNOSIS — E1122 Type 2 diabetes mellitus with diabetic chronic kidney disease: Secondary | ICD-10-CM | POA: Diagnosis present

## 2022-12-17 DIAGNOSIS — I5033 Acute on chronic diastolic (congestive) heart failure: Secondary | ICD-10-CM | POA: Diagnosis present

## 2022-12-17 DIAGNOSIS — I2510 Atherosclerotic heart disease of native coronary artery without angina pectoris: Secondary | ICD-10-CM | POA: Diagnosis present

## 2022-12-17 DIAGNOSIS — F4310 Post-traumatic stress disorder, unspecified: Secondary | ICD-10-CM | POA: Diagnosis present

## 2022-12-17 DIAGNOSIS — Z7985 Long-term (current) use of injectable non-insulin antidiabetic drugs: Secondary | ICD-10-CM | POA: Diagnosis not present

## 2022-12-17 DIAGNOSIS — F419 Anxiety disorder, unspecified: Secondary | ICD-10-CM | POA: Diagnosis present

## 2022-12-17 DIAGNOSIS — E785 Hyperlipidemia, unspecified: Secondary | ICD-10-CM | POA: Diagnosis present

## 2022-12-17 DIAGNOSIS — I2489 Other forms of acute ischemic heart disease: Secondary | ICD-10-CM | POA: Diagnosis present

## 2022-12-17 DIAGNOSIS — Z7901 Long term (current) use of anticoagulants: Secondary | ICD-10-CM | POA: Diagnosis not present

## 2022-12-17 DIAGNOSIS — Z833 Family history of diabetes mellitus: Secondary | ICD-10-CM | POA: Diagnosis not present

## 2022-12-17 DIAGNOSIS — Z79899 Other long term (current) drug therapy: Secondary | ICD-10-CM | POA: Diagnosis not present

## 2022-12-17 DIAGNOSIS — Z9081 Acquired absence of spleen: Secondary | ICD-10-CM | POA: Diagnosis not present

## 2022-12-17 DIAGNOSIS — Z1152 Encounter for screening for COVID-19: Secondary | ICD-10-CM | POA: Diagnosis not present

## 2022-12-17 DIAGNOSIS — Z794 Long term (current) use of insulin: Secondary | ICD-10-CM | POA: Diagnosis not present

## 2022-12-17 DIAGNOSIS — K219 Gastro-esophageal reflux disease without esophagitis: Secondary | ICD-10-CM | POA: Diagnosis present

## 2022-12-17 DIAGNOSIS — G8929 Other chronic pain: Secondary | ICD-10-CM | POA: Diagnosis present

## 2022-12-17 DIAGNOSIS — E1165 Type 2 diabetes mellitus with hyperglycemia: Secondary | ICD-10-CM | POA: Diagnosis present

## 2022-12-17 DIAGNOSIS — I480 Paroxysmal atrial fibrillation: Secondary | ICD-10-CM | POA: Diagnosis present

## 2022-12-17 DIAGNOSIS — E662 Morbid (severe) obesity with alveolar hypoventilation: Secondary | ICD-10-CM | POA: Diagnosis present

## 2022-12-17 LAB — BASIC METABOLIC PANEL
Anion gap: 9 (ref 5–15)
BUN: 18 mg/dL (ref 6–20)
CO2: 30 mmol/L (ref 22–32)
Calcium: 8.5 mg/dL — ABNORMAL LOW (ref 8.9–10.3)
Chloride: 97 mmol/L — ABNORMAL LOW (ref 98–111)
Creatinine, Ser: 1.49 mg/dL — ABNORMAL HIGH (ref 0.61–1.24)
GFR, Estimated: 54 mL/min — ABNORMAL LOW (ref >60)
Glucose, Bld: 255 mg/dL — ABNORMAL HIGH (ref 70–99)
Potassium: 4 mmol/L (ref 3.5–5.1)
Sodium: 136 mmol/L (ref 135–145)

## 2022-12-17 LAB — GLUCOSE, CAPILLARY
Glucose-Capillary: 256 mg/dL — ABNORMAL HIGH (ref 70–99)
Glucose-Capillary: 317 mg/dL — ABNORMAL HIGH (ref 70–99)
Glucose-Capillary: 214 mg/dL — ABNORMAL HIGH (ref 70–99)
Glucose-Capillary: 239 mg/dL — ABNORMAL HIGH (ref 70–99)

## 2022-12-17 LAB — ECHOCARDIOGRAM COMPLETE
Area-P 1/2: 3.08 cm2
Height: 72 in
S' Lateral: 3.6 cm
Weight: 5135.84 oz

## 2022-12-17 MED ORDER — PERFLUTREN LIPID MICROSPHERE
1.0000 mL | INTRAVENOUS | Status: AC | PRN
  Administered 2022-12-17: 5 mL via INTRAVENOUS

## 2022-12-17 MED ORDER — FUROSEMIDE 10 MG/ML IJ SOLN
40.0000 mg | Freq: Two times a day (BID) | INTRAMUSCULAR | Status: DC
  Administered 2022-12-17: 40 mg via INTRAVENOUS
  Filled 2022-12-17: qty 4

## 2022-12-17 MED ORDER — SPIRONOLACTONE 12.5 MG HALF TABLET: 12.5000 mg | ORAL_TABLET | Freq: Every day | ORAL | Status: DC

## 2022-12-17 MED ORDER — LIVING WELL WITH DIABETES BOOK
Freq: Once | Status: DC
  Filled 2022-12-17: qty 1

## 2022-12-17 MED ORDER — SPIRONOLACTONE 25 MG PO TABS
25.0000 mg | ORAL_TABLET | Freq: Every day | ORAL | Status: DC
  Administered 2022-12-17 – 2022-12-18 (×2): 25 mg via ORAL
  Filled 2022-12-17 (×2): qty 1

## 2022-12-17 NOTE — Inpatient Diabetes Management (Signed)
Inpatient Diabetes Program Recommendations  AACE/ADA: New Consensus Statement on Inpatient Glycemic Control (2015)  Target Ranges:  Prepandial:   less than 140 mg/dL      Peak postprandial:   less than 180 mg/dL (1-2 hours)      Critically ill patients:  140 - 180 mg/dL   Lab Results  Component Value Date   GLUCAP 256 (H) 12/17/2022   HGBA1C 12.0 (H) 12/16/2022    Review of Glycemic Control  Latest Reference Range & Units 12/16/22 14:20 12/16/22 16:40 12/16/22 21:42 12/17/22 06:23  Glucose-Capillary 70 - 99 mg/dL 119 (H) 147 (H) 829 (H) 256 (H)   Diabetes history: DM 2 Outpatient Diabetes medications:  Humalog 75/25 10-20 units bid Ozempic 2 mg weekly Current orders for Inpatient glycemic control:  Novolog 0-15 units tid with meals  Novolog 70/30 mix 20 units bid Inpatient Diabetes Program Recommendations:    Note A1C indicates poor control of diabetes (average 297 mg/dL).  Will see patient today to discuss.   Thanks  Beryl Meager, RN, BC-ADM Inpatient Diabetes Coordinator Pager [phone]  (8a-5p)

## 2022-12-17 NOTE — Inpatient Diabetes Management (Signed)
Inpatient Diabetes Program Recommendations  AACE/ADA: New Consensus Statement on Inpatient Glycemic Control (2015)  Target Ranges:  Prepandial:   less than 140 mg/dL      Peak postprandial:   less than 180 mg/dL (1-2 hours)      Critically ill patients:  140 - 180 mg/dL   Lab Results  Component Value Date   GLUCAP 317 (H) 12/17/2022   HGBA1C 12.0 (H) 12/16/2022   Spoke with pt and wife @ bedside about A1C results 12.0 (average blood glucose 297 over the past 2-3 months) and explained what an A1C is, basic pathophysiology of DM Type 2, basic home care, basic diabetes diet nutrition principles, importance of checking CBGs and maintaining good CBG control to prevent long-term and short-term complications. Reviewed signs and symptoms of hyperglycemia and hypoglycemia and how to treat hypoglycemia at home. Also reviewed blood sugar goals at home.  RNs to provide ongoing basic DM education at bedside with this patient.   Discussed basic plate method with patient and wife along with exercise recommendations according to what is ordered @ discharge home. Patient states he works fulltime @ the [employer] and is on his feet a lot but limited walking for exercise. He also drinks sweet tea sweetened with fruit puree which is high in sugar. Patient states willingness to decrease amount of carbohydrates in food and drinks.  Patient also reports he was prescribed 75/25 40 units bid ac meals but thought it was too much so has been taking 30 units bid. Patient wears a CGM and usually blood glucose shows 200's-300's. Reviewed normal ranges of fasting and post meal CBGs and importance of following up with his physicians and take his phone with sensor readings to doctors office for review. Patient and wife state understanding.  Thank you, Gary Franco. Gary Facer, RN, MSN, CDE  Diabetes Coordinator Inpatient Glycemic Control Team Team Pager [phone] (8am-5pm) 12/17/2022 3:29 PM

## 2022-12-17 NOTE — Plan of Care (Signed)
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
## 2022-12-17 NOTE — Plan of Care (Signed)
  Problem: Education: Goal: Ability to describe self-care measures that may prevent or decrease complications (Diabetes Survival Skills Education) will improve Outcome: Progressing   Problem: Coping: Goal: Ability to adjust to condition or change in health will improve Outcome: Progressing   Problem: Clinical Measurements: Goal: Diagnostic test results will improve Outcome: Progressing   Problem: Clinical Measurements: Goal: Ability to maintain clinical measurements within normal limits will improve Outcome: Progressing   Problem: Clinical Measurements: Goal: Will remain free from infection Outcome: Progressing   Problem: Clinical Measurements: Goal: Respiratory complications will improve Outcome: Progressing   Problem: Clinical Measurements: Goal: Diagnostic test results will improve Outcome: Progressing   Problem: Clinical Measurements: Goal: Cardiovascular complication will be avoided Outcome: Progressing   Problem: Activity: Goal: Risk for activity intolerance will decrease Outcome: Progressing   Problem: Nutrition: Goal: Adequate nutrition will be maintained Outcome: Progressing   Problem: Coping: Goal: Level of anxiety will decrease Outcome: Progressing   Problem: Elimination: Goal: Will not experience complications related to bowel motility Outcome: Progressing   Problem: Elimination: Goal: Will not experience complications related to urinary retention Outcome: Progressing   Problem: Pain Managment: Goal: General experience of comfort will improve Outcome: Progressing   Problem: Safety: Goal: Ability to remain free from injury will improve Outcome: Progressing   Problem: Skin Integrity: Goal: Risk for impaired skin integrity will decrease Outcome: Progressing
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
## 2022-12-18 ENCOUNTER — Encounter (HOSPITAL_COMMUNITY): Payer: Self-pay | Admitting: Internal Medicine

## 2022-12-18 ENCOUNTER — Other Ambulatory Visit (HOSPITAL_COMMUNITY): Payer: Self-pay

## 2022-12-18 DIAGNOSIS — I5031 Acute diastolic (congestive) heart failure: Secondary | ICD-10-CM

## 2022-12-18 LAB — RENAL FUNCTION PANEL
Albumin: 2.5 g/dL — ABNORMAL LOW (ref 3.5–5.0)
Anion gap: 9 (ref 5–15)
BUN: 22 mg/dL — ABNORMAL HIGH (ref 6–20)
CO2: 27 mmol/L (ref 22–32)
Calcium: 8.3 mg/dL — ABNORMAL LOW (ref 8.9–10.3)
Chloride: 97 mmol/L — ABNORMAL LOW (ref 98–111)
Creatinine, Ser: 1.53 mg/dL — ABNORMAL HIGH (ref 0.61–1.24)
GFR, Estimated: 52 mL/min — ABNORMAL LOW (ref >60)
Glucose, Bld: 325 mg/dL — ABNORMAL HIGH (ref 70–99)
Phosphorus: 4.2 mg/dL (ref 2.5–4.6)
Potassium: 4 mmol/L (ref 3.5–5.1)
Sodium: 133 mmol/L — ABNORMAL LOW (ref 135–145)

## 2022-12-18 LAB — MAGNESIUM: Magnesium: 2.1 mg/dL (ref 1.7–2.4)

## 2022-12-18 LAB — GLUCOSE, CAPILLARY
Glucose-Capillary: 311 mg/dL — ABNORMAL HIGH (ref 70–99)
Glucose-Capillary: 190 mg/dL — ABNORMAL HIGH (ref 70–99)

## 2022-12-18 MED ORDER — TRAZODONE HCL 50 MG PO TABS: 50.0000 mg | ORAL_TABLET | Freq: Every evening | ORAL | Status: DC | PRN

## 2022-12-18 MED ORDER — HYDRALAZINE HCL 20 MG/ML IJ SOLN: 10.0000 mg | INTRAMUSCULAR | Status: DC | PRN

## 2022-12-18 MED ORDER — SPIRONOLACTONE 25 MG PO TABS
25.0000 mg | ORAL_TABLET | Freq: Every day | ORAL | 0 refills | Status: DC
  Filled 2022-12-18: qty 30, 30d supply, fill #0

## 2022-12-18 MED ORDER — PANTOPRAZOLE SODIUM 40 MG PO TBEC: 40.0000 mg | DELAYED_RELEASE_TABLET | Freq: Every day | ORAL | Status: AC

## 2022-12-18 MED ORDER — HYDRALAZINE HCL 50 MG PO TABS
50.0000 mg | ORAL_TABLET | Freq: Three times a day (TID) | ORAL | 0 refills | Status: DC
  Filled 2022-12-18 – 2023-01-15 (×2): qty 90, 30d supply, fill #0

## 2022-12-18 MED ORDER — ASPIRIN 81 MG PO TBEC
81.0000 mg | DELAYED_RELEASE_TABLET | Freq: Every day | ORAL | 0 refills | Status: DC
  Filled 2022-12-18: qty 120, 120d supply, fill #0

## 2022-12-18 MED ORDER — SENNOSIDES-DOCUSATE SODIUM 8.6-50 MG PO TABS: 1.0000 | ORAL_TABLET | Freq: Every evening | ORAL | Status: DC | PRN

## 2022-12-18 MED ORDER — TORSEMIDE 20 MG PO TABS
40.0000 mg | ORAL_TABLET | Freq: Every day | ORAL | 0 refills | Status: DC
  Filled 2022-12-18: qty 60, 30d supply, fill #0

## 2022-12-18 MED ORDER — TORSEMIDE 20 MG PO TABS
40.0000 mg | ORAL_TABLET | Freq: Every day | ORAL | Status: DC
  Administered 2022-12-18: 40 mg via ORAL
  Filled 2022-12-18: qty 2

## 2022-12-18 MED ORDER — CLONIDINE HCL 0.3 MG PO TABS
0.3000 mg | ORAL_TABLET | Freq: Three times a day (TID) | ORAL | 0 refills | Status: DC
  Filled 2022-12-18 – 2023-01-15 (×2): qty 90, 30d supply, fill #0

## 2022-12-18 MED ORDER — GUAIFENESIN 100 MG/5ML PO LIQD: 5.0000 mL | ORAL | Status: DC | PRN

## 2022-12-18 MED ORDER — INSULIN ASPART PROT & ASPART (70-30 MIX) 100 UNIT/ML ~~LOC~~ SUSP
27.0000 [IU] | Freq: Two times a day (BID) | SUBCUTANEOUS | Status: DC
  Filled 2022-12-18: qty 10

## 2022-12-18 MED ORDER — METOPROLOL TARTRATE 5 MG/5ML IV SOLN: 5.0000 mg | INTRAVENOUS | Status: DC | PRN

## 2022-12-18 NOTE — Progress Notes (Signed)
Went over discharge paper work with patient, answered all questions, gave patient TOC meds, removed iv, gathered belongings and wheeled patient out to cab.
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
## 2022-12-18 NOTE — Progress Notes (Signed)
VENOUS (DVT) (7a-7p)  Result Date: 12/17/2022  Lower Venous DVT Study Patient Name:  Gary Franco  Date of Exam:   12/16/2022 Medical Rec #: [id number]   Accession #:    [id number] Date of Birth: Aug 22, 1965   Patient Gender: M Patient Age:   57 years Exam Location:  [facility] Procedure:      VAS [place] LOWER EXTREMITY VENOUS (DVT) Referring Phys: HAYLEY NAASZ --------------------------------------------------------------------------------  Indications: SOB, and Edema.  Limitations: Body habitus and Patient on BiPap, had on long shorts, unable to lie back. Comparison Study: Prior negative Right LEV done 07/11/2013 Performing Technologist: Sherren Kerns RVS  Examination Guidelines: A complete evaluation includes B-mode imaging, spectral Doppler, color Doppler, and power Doppler as needed of all accessible portions of each vessel. Bilateral testing is considered an integral part of a complete examination. Limited examinations for reoccurring indications may be performed as noted. The reflux portion of the exam is performed with the patient in reverse Trendelenburg.   +---------+---------------+---------+-----------+----------+---------------+ RIGHT    CompressibilityPhasicitySpontaneityPropertiesThrombus Aging  +---------+---------------+---------+-----------+----------+---------------+ CFV                     Yes      Yes                  patent by color +---------+---------------+---------+-----------+----------+---------------+ FV Prox  Full           Yes      Yes                                  +---------+---------------+---------+-----------+----------+---------------+ FV Mid   Full                                                         +---------+---------------+---------+-----------+----------+---------------+ FV DistalFull                                                         +---------+---------------+---------+-----------+----------+---------------+ PFV      Full           Yes      Yes                                  +---------+---------------+---------+-----------+----------+---------------+ POP      Full           Yes      Yes                                  +---------+---------------+---------+-----------+----------+---------------+ PTV      Full                                                         +---------+---------------+---------+-----------+----------+---------------+ PERO     Full                                                         +---------+---------------+---------+-----------+----------+---------------+  PEERLA, EOMI  Neck: Supple, no JVD Endocrine: No thryomegaly Cardiac: Normal S1, S2; RRR; no murmurs, rubs, or gallops Lungs: Clear to auscultation bilaterally, no wheezing, rhonchi or rales  Abd: Soft, nontender, no hepatomegaly  Ext: No edema, pulses 2+ Musculoskeletal: No deformities, BUE and BLE strength normal and equal Skin: Warm and dry, no rashes   Neuro: Alert and oriented to person, place, time, and situation, CNII-XII grossly intact, no focal deficits  Psych: Normal mood and affect   Labs  High Sensitivity Troponin:   Recent Labs  Lab 12/16/22 1111 12/16/22 1250 12/16/22 1748  TROPONINIHS 24* 19* 27*     Cardiac EnzymesNo results for input(s): "TROPONINI" in the last 168 hours. No results for input(s): "TROPIPOC" in the last 168 hours.  Chemistry Recent Labs  Lab 12/16/22 0952 12/16/22 1430 12/17/22 0217 12/18/22 0211  NA 136 137 136 133*  K 4.2 4.2 4.0 4.0  CL 102  --  97* 97*  CO2 25  --  30 27  GLUCOSE 283*  --  255* 325*  BUN 18  --  18 22*  CREATININE 1.40*  --  1.49* 1.53*  CALCIUM 8.3*  --  8.5* 8.3*   PROT 6.7  --   --   --   ALBUMIN 3.1*  --   --  2.5*  AST 18  --   --   --   ALT 22  --   --   --   ALKPHOS 80  --   --   --   BILITOT 0.4  --   --   --   GFRNONAA 58*  --  54* 52*  ANIONGAP 9  --  9 9    Hematology Recent Labs  Lab 12/16/22 0952 12/16/22 1430  WBC 16.6*  --   RBC 5.06  --   HGB 11.3* 13.9  HCT 38.2* 41.0  MCV 75.5*  --   MCH 22.3*  --   MCHC 29.6*  --   RDW 21.3*  --   PLT 460*  --    BNP Recent Labs  Lab 12/16/22 0952  BNP 63.4    DDimer No results for input(s): "DDIMER" in the last 168 hours.   Radiology  ECHOCARDIOGRAM COMPLETE  Result Date: 12/17/2022    ECHOCARDIOGRAM REPORT   Patient Name:   Gary Franco Date of Exam: 12/17/2022 Medical Rec #:  [id number]  Height:       72.0 in Accession #:    [id number] Weight:       321.0 lb Date of Birth:  1965-12-15  BSA:          2.605 m Patient Age:    57 years   BP:           125/62 mmHg Patient Gender: M          HR:           61 bpm. Exam Location:  Inpatient Procedure: 2D Echo, Cardiac Doppler, Color Doppler and Intracardiac            Opacification Agent Indications:    CHF-Acute Diastolic I50.31  History:        Patient has prior history of Echocardiogram examinations, most                 recent 07/28/2020. Risk Factors:Diabetes and Hypertension.  Sonographer:    Harriette Bouillon RDCS Referring Phys: [id number] Alessandra Bevels IMPRESSIONS  1. Left ventricular ejection fraction, by estimation, is 55 to 60%.  PEERLA, EOMI  Neck: Supple, no JVD Endocrine: No thryomegaly Cardiac: Normal S1, S2; RRR; no murmurs, rubs, or gallops Lungs: Clear to auscultation bilaterally, no wheezing, rhonchi or rales  Abd: Soft, nontender, no hepatomegaly  Ext: No edema, pulses 2+ Musculoskeletal: No deformities, BUE and BLE strength normal and equal Skin: Warm and dry, no rashes   Neuro: Alert and oriented to person, place, time, and situation, CNII-XII grossly intact, no focal deficits  Psych: Normal mood and affect   Labs  High Sensitivity Troponin:   Recent Labs  Lab 12/16/22 1111 12/16/22 1250 12/16/22 1748  TROPONINIHS 24* 19* 27*     Cardiac EnzymesNo results for input(s): "TROPONINI" in the last 168 hours. No results for input(s): "TROPIPOC" in the last 168 hours.  Chemistry Recent Labs  Lab 12/16/22 0952 12/16/22 1430 12/17/22 0217 12/18/22 0211  NA 136 137 136 133*  K 4.2 4.2 4.0 4.0  CL 102  --  97* 97*  CO2 25  --  30 27  GLUCOSE 283*  --  255* 325*  BUN 18  --  18 22*  CREATININE 1.40*  --  1.49* 1.53*  CALCIUM 8.3*  --  8.5* 8.3*   PROT 6.7  --   --   --   ALBUMIN 3.1*  --   --  2.5*  AST 18  --   --   --   ALT 22  --   --   --   ALKPHOS 80  --   --   --   BILITOT 0.4  --   --   --   GFRNONAA 58*  --  54* 52*  ANIONGAP 9  --  9 9    Hematology Recent Labs  Lab 12/16/22 0952 12/16/22 1430  WBC 16.6*  --   RBC 5.06  --   HGB 11.3* 13.9  HCT 38.2* 41.0  MCV 75.5*  --   MCH 22.3*  --   MCHC 29.6*  --   RDW 21.3*  --   PLT 460*  --    BNP Recent Labs  Lab 12/16/22 0952  BNP 63.4    DDimer No results for input(s): "DDIMER" in the last 168 hours.   Radiology  ECHOCARDIOGRAM COMPLETE  Result Date: 12/17/2022    ECHOCARDIOGRAM REPORT   Patient Name:   Gary Franco Date of Exam: 12/17/2022 Medical Rec #:  [id number]  Height:       72.0 in Accession #:    [id number] Weight:       321.0 lb Date of Birth:  1965-12-15  BSA:          2.605 m Patient Age:    57 years   BP:           125/62 mmHg Patient Gender: M          HR:           61 bpm. Exam Location:  Inpatient Procedure: 2D Echo, Cardiac Doppler, Color Doppler and Intracardiac            Opacification Agent Indications:    CHF-Acute Diastolic I50.31  History:        Patient has prior history of Echocardiogram examinations, most                 recent 07/28/2020. Risk Factors:Diabetes and Hypertension.  Sonographer:    Harriette Bouillon RDCS Referring Phys: [id number] Alessandra Bevels IMPRESSIONS  1. Left ventricular ejection fraction, by estimation, is 55 to 60%.  PEERLA, EOMI  Neck: Supple, no JVD Endocrine: No thryomegaly Cardiac: Normal S1, S2; RRR; no murmurs, rubs, or gallops Lungs: Clear to auscultation bilaterally, no wheezing, rhonchi or rales  Abd: Soft, nontender, no hepatomegaly  Ext: No edema, pulses 2+ Musculoskeletal: No deformities, BUE and BLE strength normal and equal Skin: Warm and dry, no rashes   Neuro: Alert and oriented to person, place, time, and situation, CNII-XII grossly intact, no focal deficits  Psych: Normal mood and affect   Labs  High Sensitivity Troponin:   Recent Labs  Lab 12/16/22 1111 12/16/22 1250 12/16/22 1748  TROPONINIHS 24* 19* 27*     Cardiac EnzymesNo results for input(s): "TROPONINI" in the last 168 hours. No results for input(s): "TROPIPOC" in the last 168 hours.  Chemistry Recent Labs  Lab 12/16/22 0952 12/16/22 1430 12/17/22 0217 12/18/22 0211  NA 136 137 136 133*  K 4.2 4.2 4.0 4.0  CL 102  --  97* 97*  CO2 25  --  30 27  GLUCOSE 283*  --  255* 325*  BUN 18  --  18 22*  CREATININE 1.40*  --  1.49* 1.53*  CALCIUM 8.3*  --  8.5* 8.3*   PROT 6.7  --   --   --   ALBUMIN 3.1*  --   --  2.5*  AST 18  --   --   --   ALT 22  --   --   --   ALKPHOS 80  --   --   --   BILITOT 0.4  --   --   --   GFRNONAA 58*  --  54* 52*  ANIONGAP 9  --  9 9    Hematology Recent Labs  Lab 12/16/22 0952 12/16/22 1430  WBC 16.6*  --   RBC 5.06  --   HGB 11.3* 13.9  HCT 38.2* 41.0  MCV 75.5*  --   MCH 22.3*  --   MCHC 29.6*  --   RDW 21.3*  --   PLT 460*  --    BNP Recent Labs  Lab 12/16/22 0952  BNP 63.4    DDimer No results for input(s): "DDIMER" in the last 168 hours.   Radiology  ECHOCARDIOGRAM COMPLETE  Result Date: 12/17/2022    ECHOCARDIOGRAM REPORT   Patient Name:   Gary Franco Date of Exam: 12/17/2022 Medical Rec #:  [id number]  Height:       72.0 in Accession #:    [id number] Weight:       321.0 lb Date of Birth:  1965-12-15  BSA:          2.605 m Patient Age:    57 years   BP:           125/62 mmHg Patient Gender: M          HR:           61 bpm. Exam Location:  Inpatient Procedure: 2D Echo, Cardiac Doppler, Color Doppler and Intracardiac            Opacification Agent Indications:    CHF-Acute Diastolic I50.31  History:        Patient has prior history of Echocardiogram examinations, most                 recent 07/28/2020. Risk Factors:Diabetes and Hypertension.  Sonographer:    Harriette Bouillon RDCS Referring Phys: [id number] Alessandra Bevels IMPRESSIONS  1. Left ventricular ejection fraction, by estimation, is 55 to 60%.  PEERLA, EOMI  Neck: Supple, no JVD Endocrine: No thryomegaly Cardiac: Normal S1, S2; RRR; no murmurs, rubs, or gallops Lungs: Clear to auscultation bilaterally, no wheezing, rhonchi or rales  Abd: Soft, nontender, no hepatomegaly  Ext: No edema, pulses 2+ Musculoskeletal: No deformities, BUE and BLE strength normal and equal Skin: Warm and dry, no rashes   Neuro: Alert and oriented to person, place, time, and situation, CNII-XII grossly intact, no focal deficits  Psych: Normal mood and affect   Labs  High Sensitivity Troponin:   Recent Labs  Lab 12/16/22 1111 12/16/22 1250 12/16/22 1748  TROPONINIHS 24* 19* 27*     Cardiac EnzymesNo results for input(s): "TROPONINI" in the last 168 hours. No results for input(s): "TROPIPOC" in the last 168 hours.  Chemistry Recent Labs  Lab 12/16/22 0952 12/16/22 1430 12/17/22 0217 12/18/22 0211  NA 136 137 136 133*  K 4.2 4.2 4.0 4.0  CL 102  --  97* 97*  CO2 25  --  30 27  GLUCOSE 283*  --  255* 325*  BUN 18  --  18 22*  CREATININE 1.40*  --  1.49* 1.53*  CALCIUM 8.3*  --  8.5* 8.3*   PROT 6.7  --   --   --   ALBUMIN 3.1*  --   --  2.5*  AST 18  --   --   --   ALT 22  --   --   --   ALKPHOS 80  --   --   --   BILITOT 0.4  --   --   --   GFRNONAA 58*  --  54* 52*  ANIONGAP 9  --  9 9    Hematology Recent Labs  Lab 12/16/22 0952 12/16/22 1430  WBC 16.6*  --   RBC 5.06  --   HGB 11.3* 13.9  HCT 38.2* 41.0  MCV 75.5*  --   MCH 22.3*  --   MCHC 29.6*  --   RDW 21.3*  --   PLT 460*  --    BNP Recent Labs  Lab 12/16/22 0952  BNP 63.4    DDimer No results for input(s): "DDIMER" in the last 168 hours.   Radiology  ECHOCARDIOGRAM COMPLETE  Result Date: 12/17/2022    ECHOCARDIOGRAM REPORT   Patient Name:   Gary Franco Date of Exam: 12/17/2022 Medical Rec #:  [id number]  Height:       72.0 in Accession #:    [id number] Weight:       321.0 lb Date of Birth:  1965-12-15  BSA:          2.605 m Patient Age:    57 years   BP:           125/62 mmHg Patient Gender: M          HR:           61 bpm. Exam Location:  Inpatient Procedure: 2D Echo, Cardiac Doppler, Color Doppler and Intracardiac            Opacification Agent Indications:    CHF-Acute Diastolic I50.31  History:        Patient has prior history of Echocardiogram examinations, most                 recent 07/28/2020. Risk Factors:Diabetes and Hypertension.  Sonographer:    Harriette Bouillon RDCS Referring Phys: [id number] Alessandra Bevels IMPRESSIONS  1. Left ventricular ejection fraction, by estimation, is 55 to 60%.  PEERLA, EOMI  Neck: Supple, no JVD Endocrine: No thryomegaly Cardiac: Normal S1, S2; RRR; no murmurs, rubs, or gallops Lungs: Clear to auscultation bilaterally, no wheezing, rhonchi or rales  Abd: Soft, nontender, no hepatomegaly  Ext: No edema, pulses 2+ Musculoskeletal: No deformities, BUE and BLE strength normal and equal Skin: Warm and dry, no rashes   Neuro: Alert and oriented to person, place, time, and situation, CNII-XII grossly intact, no focal deficits  Psych: Normal mood and affect   Labs  High Sensitivity Troponin:   Recent Labs  Lab 12/16/22 1111 12/16/22 1250 12/16/22 1748  TROPONINIHS 24* 19* 27*     Cardiac EnzymesNo results for input(s): "TROPONINI" in the last 168 hours. No results for input(s): "TROPIPOC" in the last 168 hours.  Chemistry Recent Labs  Lab 12/16/22 0952 12/16/22 1430 12/17/22 0217 12/18/22 0211  NA 136 137 136 133*  K 4.2 4.2 4.0 4.0  CL 102  --  97* 97*  CO2 25  --  30 27  GLUCOSE 283*  --  255* 325*  BUN 18  --  18 22*  CREATININE 1.40*  --  1.49* 1.53*  CALCIUM 8.3*  --  8.5* 8.3*   PROT 6.7  --   --   --   ALBUMIN 3.1*  --   --  2.5*  AST 18  --   --   --   ALT 22  --   --   --   ALKPHOS 80  --   --   --   BILITOT 0.4  --   --   --   GFRNONAA 58*  --  54* 52*  ANIONGAP 9  --  9 9    Hematology Recent Labs  Lab 12/16/22 0952 12/16/22 1430  WBC 16.6*  --   RBC 5.06  --   HGB 11.3* 13.9  HCT 38.2* 41.0  MCV 75.5*  --   MCH 22.3*  --   MCHC 29.6*  --   RDW 21.3*  --   PLT 460*  --    BNP Recent Labs  Lab 12/16/22 0952  BNP 63.4    DDimer No results for input(s): "DDIMER" in the last 168 hours.   Radiology  ECHOCARDIOGRAM COMPLETE  Result Date: 12/17/2022    ECHOCARDIOGRAM REPORT   Patient Name:   Gary Franco Date of Exam: 12/17/2022 Medical Rec #:  [id number]  Height:       72.0 in Accession #:    [id number] Weight:       321.0 lb Date of Birth:  1965-12-15  BSA:          2.605 m Patient Age:    57 years   BP:           125/62 mmHg Patient Gender: M          HR:           61 bpm. Exam Location:  Inpatient Procedure: 2D Echo, Cardiac Doppler, Color Doppler and Intracardiac            Opacification Agent Indications:    CHF-Acute Diastolic I50.31  History:        Patient has prior history of Echocardiogram examinations, most                 recent 07/28/2020. Risk Factors:Diabetes and Hypertension.  Sonographer:    Harriette Bouillon RDCS Referring Phys: [id number] Alessandra Bevels IMPRESSIONS  1. Left ventricular ejection fraction, by estimation, is 55 to 60%.

## 2022-12-18 NOTE — Progress Notes (Signed)
   Heart Failure Stewardship Pharmacist Progress Note   PCP: Fleet Contras, MD PCP-Cardiologist: Elder Negus, MD    HPI:  57 yo M with PMH of HTN, HLD, T2DM, CAD, back pain, afib, sleep apnea, CKD, and GERD.   Admitted in 07/2020 with shortness of breath. EF was 55-60%. Cath at that time showed nonobstructive CAD. Patient was managed medically for HTN and CAD.   Presented to the ED on 9/22 with shortness of breath, orthopnea, and LE edema. CXR with cardiomegaly and mild pulmonary vascular congestion. CTA negative for PE. ECHO 9/24 showed LVEF 55-60%, no RWMA, mild concentric LVH, RV not well visualized.   Current HF Medications: Diuretic: torsemide 40 mg daily MRA: spironolactone 25 mg daily Other: hydralazine 50 mg TID  Prior to admission HF Medications: Other: hydralazine 150 mg BID  Pertinent Lab Values: Serum creatinine 1.53, BUN 22, Potassium 4.0, Sodium 133, BNP 63.4, Magnesium 2.1, A1c 12   Vital Signs: Weight: 323 lbs (admission weight: 325 lbs) Blood pressure: 120/60s  Heart rate: 60s  I/O: net -1.2L yesterday; net -5.4L since admission  Medication Assistance / Insurance Benefits Check: Does the patient have prescription insurance?  Yes Type of insurance plan: [place] Medicaid  Outpatient Pharmacy:  Prior to admission outpatient pharmacy: [facility] Is the patient willing to use [facility] TOC pharmacy at discharge? Yes Is the patient willing to transition their outpatient pharmacy to utilize a [facility] outpatient pharmacy?   No    Assessment: 1. Acute on chronic diastolic CHF (LVEF 55-60%). NYHA class II symptoms. - Agree with transitioning to torsemide 40 mg daily. Volume status improved. Strict I/Os and daily weights. Keep K>4 and Mg>2. - Agree with adding spironolactone 25 mg daily - Continue hydralazine 50 mg TID (reports taking 150 mg BID at home) - No SGLT2i with uncontrolled diabetes (A1c 12%)   Plan: 1) Medication changes recommended at this time: -  Agree with changes  2) Patient assistance: - None pending, has Medicaid  3)  Education  - Patient has been educated on current HF medications and potential additions to HF medication regimen - Patient verbalizes understanding that over the next few months, these medication doses may change and more medications may be added to optimize HF regimen - Patient has been educated on basic disease state pathophysiology and goals of therapy   Sharen Hones, PharmD, BCPS Heart Failure Stewardship Pharmacist Phone [phone]
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
## 2022-12-22 ENCOUNTER — Other Ambulatory Visit: Payer: Self-pay | Admitting: Cardiology

## 2022-12-31 ENCOUNTER — Telehealth (HOSPITAL_COMMUNITY): Payer: Self-pay
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
## 2022-12-31 NOTE — Telephone Encounter (Signed)
Spoke to patient regarding appointment scheduled for tomorrow. Aware of time.

## 2023-01-01 ENCOUNTER — Telehealth (HOSPITAL_COMMUNITY): Payer: Self-pay | Admitting: Adult Health

## 2023-01-01 ENCOUNTER — Ambulatory Visit (HOSPITAL_COMMUNITY)
Admit: 2023-01-01 | Discharge: 2023-01-01 | Disposition: A | Discharge status: Home / Self Care | Payer: Medicaid Other | Attending: Adult Health | Admitting: Adult Health

## 2023-01-01 ENCOUNTER — Encounter (HOSPITAL_COMMUNITY): Payer: Self-pay

## 2023-01-01 VITALS — BP 174/78 | HR 67 | Wt 327.0 lb

## 2023-01-01 DIAGNOSIS — Z7901 Long term (current) use of anticoagulants: Secondary | ICD-10-CM | POA: Diagnosis not present

## 2023-01-01 DIAGNOSIS — I480 Paroxysmal atrial fibrillation: Secondary | ICD-10-CM | POA: Insufficient documentation

## 2023-01-01 DIAGNOSIS — I2510 Atherosclerotic heart disease of native coronary artery without angina pectoris: Secondary | ICD-10-CM | POA: Insufficient documentation

## 2023-01-01 DIAGNOSIS — E119 Type 2 diabetes mellitus without complications: Secondary | ICD-10-CM | POA: Insufficient documentation

## 2023-01-01 DIAGNOSIS — Z8249 Family history of ischemic heart disease and other diseases of the circulatory system: Secondary | ICD-10-CM | POA: Insufficient documentation

## 2023-01-01 DIAGNOSIS — I10 Essential (primary) hypertension: Secondary | ICD-10-CM

## 2023-01-01 DIAGNOSIS — I5032 Chronic diastolic (congestive) heart failure: Secondary | ICD-10-CM | POA: Insufficient documentation

## 2023-01-01 DIAGNOSIS — E785 Hyperlipidemia, unspecified: Secondary | ICD-10-CM | POA: Diagnosis not present

## 2023-01-01 DIAGNOSIS — Z7985 Long-term (current) use of injectable non-insulin antidiabetic drugs: Secondary | ICD-10-CM | POA: Insufficient documentation

## 2023-01-01 DIAGNOSIS — I110 Hypertensive heart disease with heart failure: Secondary | ICD-10-CM | POA: Diagnosis present

## 2023-01-01 DIAGNOSIS — G4733 Obstructive sleep apnea (adult) (pediatric): Secondary | ICD-10-CM | POA: Diagnosis not present

## 2023-01-01 DIAGNOSIS — I509 Heart failure, unspecified: Secondary | ICD-10-CM

## 2023-01-01 LAB — BASIC METABOLIC PANEL
Anion gap: 10 (ref 5–15)
BUN: 18 mg/dL (ref 6–20)
CO2: 27 mmol/L (ref 22–32)
Calcium: 9.1 mg/dL (ref 8.9–10.3)
Chloride: 99 mmol/L (ref 98–111)
Creatinine, Ser: 1.36 mg/dL — ABNORMAL HIGH (ref 0.61–1.24)
GFR, Estimated: >60 mL/min (ref >60)
Glucose, Bld: 316 mg/dL — ABNORMAL HIGH (ref 70–99)
Potassium: 4.3 mmol/L (ref 3.5–5.1)
Sodium: 136 mmol/L (ref 135–145)

## 2023-01-01 NOTE — Patient Instructions (Signed)
Medication Changes:  No Changes In Medications at this time.   Lab Work:  Labs done today, your results will be available in MyChart, we will contact you for abnormal readings.  Follow-Up in: AS NEEDED   At the [facility], you and your health needs are our priority. We have a designated team specialized in the treatment of Heart Failure. This Care Team includes your primary Heart Failure Specialized Cardiologist (physician), Advanced Practice Providers (APPs- Physician Assistants and Nurse Practitioners), and Pharmacist who all work together to provide you with the care you need, when you need it.   You may see any of the following providers on your designated Care Team at your next follow up:  Dr. Arvilla Meres Dr. Marca Ancona Dr. Dorthula Nettles Dr. Theresia Bough Tonye Becket, NP Robbie Lis, [place] [facility] [place], [place] Brynda Peon, NP [place] Lee, NP Karle Plumber, PharmD   Please be sure to bring in all your medications bottles to every appointment.   Need to Contact us:  If you have any questions or concerns before your next appointment please send [place] a message through [place] or call our office at [phone].    TO LEAVE A MESSAGE FOR THE NURSE SELECT OPTION 2, PLEASE LEAVE A MESSAGE INCLUDING: YOUR NAME DATE OF BIRTH CALL BACK NUMBER REASON FOR CALL**this is important as we prioritize the call backs  YOU WILL RECEIVE A CALL BACK THE SAME DAY AS LONG AS YOU CALL BEFORE 4:00 PM

## 2023-01-01 NOTE — Telephone Encounter (Signed)
Called and provided with lab results. Renal function stable. No change.   Glucose running high needs follow up with his PCP. I personally called.    Mr Vi appreciated the call.   Yousof Alderman NP-C  11:24 AM

## 2023-01-02 ENCOUNTER — Ambulatory Visit: Payer: Medicaid Other | Admitting: Cardiology

## 2023-01-14 ENCOUNTER — Ambulatory Visit: Payer: Medicaid Other | Admitting: Cardiology

## 2023-01-15 ENCOUNTER — Other Ambulatory Visit: Payer: Self-pay

## 2023-01-17 ENCOUNTER — Encounter: Payer: Self-pay | Admitting: Cardiology

## 2023-01-17 ENCOUNTER — Other Ambulatory Visit: Payer: Self-pay

## 2023-01-17 ENCOUNTER — Ambulatory Visit: Payer: Medicaid Other | Attending: Cardiology | Admitting: Cardiology

## 2023-01-17 VITALS — BP 160/84 | HR 66 | Resp 14 | Ht 72.0 in | Wt 321.0 lb

## 2023-01-17 DIAGNOSIS — I10 Essential (primary) hypertension: Secondary | ICD-10-CM

## 2023-01-17 DIAGNOSIS — I5032 Chronic diastolic (congestive) heart failure: Secondary | ICD-10-CM

## 2023-01-17 DIAGNOSIS — I480 Paroxysmal atrial fibrillation: Secondary | ICD-10-CM

## 2023-01-17 MED ORDER — HYDRALAZINE HCL 100 MG PO TABS
100.0000 mg | ORAL_TABLET | Freq: Three times a day (TID) | ORAL | 1 refills | Status: DC
Start: 2023-01-17 — End: 2023-02-04

## 2023-01-17 MED ORDER — TORSEMIDE 20 MG PO TABS
40.0000 mg | ORAL_TABLET | Freq: Every day | ORAL | 2 refills | Status: DC | PRN
Start: 2023-01-17 — End: 2023-05-29

## 2023-01-17 MED ORDER — CLONIDINE HCL 0.3 MG PO TABS: 0.3000 mg | ORAL_TABLET | Freq: Two times a day (BID) | ORAL | 2 refills | Status: DC

## 2023-01-17 NOTE — Patient Instructions (Signed)
Medication Instructions:   STOP TAKING ASPIRIN NOW  DECREASE YOUR CLONIDINE TO 0.3 MG BY MOUTH TWICE DAILY  DECREASE YOUR TORSEMIDE TO 40 MG BY MOUTH DAILY AS NEEDED FOR LOWER EXTREMITY SWELLING/WEIGHT GAIN  INCREASE YOUR HYDRALAZINE TO 100 MG BY MOUTH THREE TIMES DAILY  *If you need a refill on your cardiac medications before your next appointment, please call your pharmacy*    You have been referred to SEE PHARMACIST HERE IN THE OFFICE FOR BLOOD PRESSURE MANAGEMENT IN 2-3 WEEKS     Follow-Up: At [facility], you and your health needs are our priority.  As part of our continuing mission to provide you with exceptional heart care, we have created designated Provider Care Teams.  These Care Teams include your primary Cardiologist (physician) and Advanced Practice Providers (APPs -  Physician Assistants and Nurse Practitioners) who all work together to provide you with the care you need, when you need it.  We recommend signing up for the patient portal called "MyChart".  Sign up information is provided on this After Visit Summary.  MyChart is used to connect with patients for Virtual Visits (Telemedicine).  Patients are able to view lab/test results, encounter notes, upcoming appointments, etc.  Non-urgent messages can be sent to your provider as well.   To learn more about what you can do with MyChart, go to [email or web address].    Your next appointment:   6 month(s)  Provider:   Elder Negus, MD

## 2023-01-17 NOTE — Progress Notes (Addendum)
Cardiology Office Note:  .   Date:  01/17/2023  ID:  Gary Franco, DOB 1965/06/10, MRN [id number] PCP: Fleet Contras, MD  [facility] Providers Cardiologist:  Truett Mainland, MD PCP: Fleet Contras, MD  Chief Complaint  Patient presents with   Essential hypertension   Paroxysmal atrial fibrillation      History of Present Illness: .    Gary Franco is a 57 y.o. male with with hypertension, hyperlipidemia, type 2 DM, OSA, paroxysmal Afib, HFpEF, obesity  Patient was hospitalized in 11/2022 with acute on chronic HFpEF, hypertension.  Patient's breathing has been better, leg edema has resolved.  Blood pressure is elevated today.  Systolic blood pressures at home range from 120 to 160 mmHg.  He continues to have dizziness symptoms, but has not noticed any extremely low blood pressures.  He is following low-salt diet.  He has lost close to 80 pounds on Ozempic.   Vitals:   01/17/23 0803  BP: (!) 160/84  Pulse: 66  Resp: 14  SpO2: 98%     ROS:  Review of Systems  Cardiovascular:  Negative for chest pain, dyspnea on exertion, leg swelling, palpitations and syncope.  Neurological:  Positive for dizziness.     Studies Reviewed: .       Echocardiogram 12/17/2022: 1. Left ventricular ejection fraction, by estimation, is 55 to 60%. The  left ventricle has normal function. The left ventricle has no regional  wall motion abnormalities. There is mild concentric left ventricular  hypertrophy. Left ventricular diastolic parameters are indeterminate.   2. Right ventricular systolic function was not well visualized. The right  ventricular size is not well visualized.   3. Left atrial size was mildly dilated.   4. The mitral valve is normal in structure. No evidence of mitral valve  regurgitation. No evidence of mitral stenosis.   5. The aortic valve is tricuspid. There is mild calcification of the  aortic valve. There is mild thickening of the aortic valve. Aortic valve   regurgitation is not visualized. No aortic stenosis is present.   Comparison(s): No significant change from prior study.   Risk Assessment/Calculations:    CHA2DS2-VASc Score = 4 This indicates a 4.8% annual risk of stroke. The patient's score is based upon: CHF History: 1 HTN History: 1 Diabetes History: 1 Stroke History: 0 Vascular Disease History: 1 Age Score: 0 Gender Score: 0    Physical Exam:   Physical Exam Vitals and nursing note reviewed.  Constitutional:      General: He is not in acute distress. Neck:     Vascular: No JVD.  Cardiovascular:     Rate and Rhythm: Normal rate and regular rhythm.     Heart sounds: Normal heart sounds. No murmur heard. Pulmonary:     Effort: Pulmonary effort is normal.     Breath sounds: Normal breath sounds. No wheezing or rales.  Musculoskeletal:     Right lower leg: No edema.     Left lower leg: No edema.      VISIT DIAGNOSES:   ICD-10-CM   1. Chronic heart failure with preserved ejection fraction (HCC)  I50.32     2. Essential hypertension  I10 torsemide (DEMADEX) 20 MG tablet    cloNIDine (CATAPRES) 0.3 MG tablet    hydrALAZINE (APRESOLINE) 100 MG tablet    AMB Referral to [facility] Pharm-D    3. Paroxysmal atrial fibrillation (HCC)  I48.0        ASSESSMENT AND PLAN: .    Gary Jupiter  Franco is a 57 y.o. male with hypertension, hyperlipidemia, type 2 DM, OSA, paroxysmal Afib, HFpEF, obesity  HFpEF: Primarily due to hypertension and obesity. Is working diligently on obesity and has lost 80 pounds on Ozempic.  Congratulated him regarding the same. Discussed low-salt diet, which he has been following. See below regarding hypertension management.  Hypertension: Uncontrolled.  Likely cause of his dizziness.  Increase hydralazine from 100 mg twice daily to 100 mg 3 times daily. Close follow-up with Pharm.D. regarding hypertension management in 2-3 weeks.   PAF: Maintaining sinus rhythm. Continue Eliquis 5 mg daily. I  do not see any separate indication for aspirin, therefore I will stop this today.  Meds ordered this encounter  Medications   torsemide (DEMADEX) 20 MG tablet    Sig: Take 2 tablets (40 mg total) by mouth daily as needed (lower extremity swelling).    Dispense:  90 tablet    Refill:  2    Dose decrease   cloNIDine (CATAPRES) 0.3 MG tablet    Sig: Take 1 tablet (0.3 mg total) by mouth 2 (two) times daily.    Dispense:  180 tablet    Refill:  2    Dose decrease   hydrALAZINE (APRESOLINE) 100 MG tablet    Sig: Take 1 tablet (100 mg total) by mouth 3 (three) times daily.    Dispense:  270 tablet    Refill:  1    Dose increase     F/u in 6 months  Signed, Elder Negus, MD
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
## 2023-02-04 ENCOUNTER — Ambulatory Visit: Payer: Medicaid Other | Attending: Cardiology | Admitting: Pharmacist

## 2023-02-04 ENCOUNTER — Encounter: Payer: Self-pay | Admitting: Pharmacist

## 2023-02-04 VITALS — BP 114/60 | HR 57 | Wt 327.4 lb

## 2023-02-04 DIAGNOSIS — I10 Essential (primary) hypertension: Secondary | ICD-10-CM | POA: Diagnosis not present

## 2023-02-04 MED ORDER — CLONIDINE HCL 0.3 MG PO TABS: ORAL_TABLET | ORAL | Status: DC

## 2023-02-04 MED ORDER — SPIRONOLACTONE 25 MG PO TABS
25.0000 mg | ORAL_TABLET | Freq: Every day | ORAL | 3 refills | Status: DC
Start: 2023-02-04 — End: 2023-11-13

## 2023-02-04 MED ORDER — HYDRALAZINE HCL 100 MG PO TABS: 100.0000 mg | ORAL_TABLET | Freq: Two times a day (BID) | ORAL | Status: DC

## 2023-02-04 NOTE — Assessment & Plan Note (Addendum)
Assessment  - Clinic BP of 114/60 mmHg controlled below goal <130/80 mmHg. Per patient, home BP are slightly higher, mostly 130s/70s. Does not bring home cuff or list of readings today.  - Suspect that nonadherence contributed to prior HTN. Unclear why ACEi, hydrochlorothiazide, and amlodipine were discontinued in the past.  - Pt at increased risk for rebound HTN and AE, such as dizziness, with clonidine. Given BP well within goal today, would like to begin taper.  - With concomitant HFpEF and LE edema on exam today, feel that pt would be a good candidate for Entresto (sacubitril/valsartan) to prevent HF exacerbations and help with diuresis as he is not currently taking torsemide. Will initiate Entresto while tapering off clonidine.   Plan - DECREASE clonidine to 0.15 mg (1/2 tab) qam and 0.3 mg (1 tab) qpm x 1 week, THEN decrease to 0.15 mg (1/2 tab) BID.  - Continue hydralazine 100 mg PO BID, spironolactone 25 mg PO daily, diltiazem 240 mg PO daily - Sent refill of spironolactone to preferred pharmacy - Instructed patient to call or send message if BP are frequently > 130/80 mmHg as he decreases his clonidine, as we can initiate Entresto 24-26 mg PO BID prior to his follow-up appt.  - Instructed pt to bring BP log and cuff to next appointment - Pharmacist follow up ~3 weeks.

## 2023-02-04 NOTE — Patient Instructions (Addendum)
It was great to see you today!  Please decrease your clonidine to 1/2 tablet (0.15 mg) in the morning and 1 full tablet (0.3 mg) in the evening for one week. THEN decrease to 1/2 tablet (0.15 mg) in the morning and evening.   As your blood pressure starts to increase after coming off the clonidine, we will have you start a new medication called Entresto (sacubitril/valsartan) 24-26 mg twice daily.   Please continue all other medications as prescribed including hydralazine 100 mg twice daily, spironolactone 25 mg daily, and diltiazem 240 mg daily.   Please bring your home cuff and a list of blood pressure readings to your next appointment.   Give [place] a call or MyChart message if you notice blood pressures frequently over 130/80 prior to your next appointment.
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
## 2023-02-26 ENCOUNTER — Encounter: Payer: Self-pay | Admitting: Pharmacist

## 2023-02-26 ENCOUNTER — Encounter: Payer: Self-pay | Admitting: Student

## 2023-02-26 ENCOUNTER — Ambulatory Visit: Payer: Medicaid Other | Attending: Internal Medicine | Admitting: Student

## 2023-02-26 VITALS — BP 116/60 | HR 56

## 2023-02-26 DIAGNOSIS — I10 Essential (primary) hypertension: Secondary | ICD-10-CM

## 2023-02-26 DIAGNOSIS — I5032 Chronic diastolic (congestive) heart failure: Secondary | ICD-10-CM | POA: Diagnosis not present

## 2023-02-26 MED ORDER — EMPAGLIFLOZIN 10 MG PO TABS: 10.0000 mg | ORAL_TABLET | Freq: Every day | ORAL | 11 refills | Status: DC

## 2023-02-26 NOTE — Patient Instructions (Addendum)
Changes made by your pharmacist Carmela Hurt, PharmD at today's visit:    Instructions/Changes  (what do you need to do) Your Notes  (what you did and when you did it)  Start taking Jardiance 10 mg daily and get lab done on Dec 18.    Reduce clonidine dose from 1 tab (0.3 mg)twice daily to 1/2 tab in the morning and 1 tablet in the evening for 1 week then start taking 1/2 tab twice daily for 1 week then stop    Continue taking diltiazem 240 mg daily, hydralazine 100 mg  twice daily , spironolactone 25 mg daily    Continue with low salt diet and regular physical activity     Bring all of your meds, your BP cuff and your record of home blood pressures to your next appointment.    HOW TO TAKE YOUR BLOOD PRESSURE AT HOME  Rest 5 minutes before taking your blood pressure.  Don't smoke or drink caffeinated beverages for at least 30 minutes before. Take your blood pressure before (not after) you eat. Sit comfortably with your back supported and both feet on the floor (don't cross your legs). Elevate your arm to heart level on a table or a desk. Use the proper sized cuff. It should fit smoothly and snugly around your bare upper arm. There should be enough room to slip a fingertip under the cuff. The bottom edge of the cuff should be 1 inch above the crease of the elbow. Ideally, take 3 measurements at one sitting and record the average.  Important lifestyle changes to control high blood pressure  Intervention  Effect on the BP  Lose extra pounds and watch your waistline Weight loss is one of the most effective lifestyle changes for controlling blood pressure. If you're overweight or obese, losing even a small amount of weight can help reduce blood pressure. Blood pressure might go down by about 1 millimeter of mercury (mm Hg) with each kilogram (about 2.2 pounds) of weight lost.  Exercise regularly As a general goal, aim for at least 30 minutes of moderate physical activity every day. Regular  physical activity can lower high blood pressure by about 5 to 8 mm Hg.  Eat a healthy diet Eating a diet rich in whole grains, fruits, vegetables, and low-fat dairy products and low in saturated fat and cholesterol. A healthy diet can lower high blood pressure by up to 11 mm Hg.  Reduce salt (sodium) in your diet Even a small reduction of sodium in the diet can improve heart health and reduce high blood pressure by about 5 to 6 mm Hg.  Limit alcohol One drink equals 12 ounces of beer, 5 ounces of wine, or 1.5 ounces of 80-proof liquor.  Limiting alcohol to less than one drink a day for women or two drinks a day for men can help lower blood pressure by about 4 mm Hg.   If you have any questions or concerns please use My Chart to send questions or call the office at [phone]

## 2023-02-26 NOTE — Assessment & Plan Note (Addendum)
Assessment: BP is controlled in office BP 116/60 heart rate 56 (goal <130/80) Home BP ~120/70-77 range forgot to bring home BP log and home BP machine  Still takes clonidine 0.3 mg twice daily, could not tolerate hydralazine 100 mg three times daily dose due to dizziness so self adjusted dose to 100 mg twice daily  Takes diltiazem and spironolactone as prescribed  Uses pill box to track adherence has not missed any medications in past 2 weeks  Denies SOB, palpitation, chest pain, headaches,or swelling Follows low salt diet and exercises 3 times per week ( walking 2 miles)  Discussed 4 pillars of HF medications again - on CGM 90 days avg BG 8.7 patient wanted to add Jardiance to get  better BG control   Plan:  Start taking Jardiance 10 mg daily and get lab done on Dec 18 Reduce clonidine dose from 1 tab (0.3 mg)twice daily to 1/2 tab in the morning and 1 tablet in the evening for 1 week then start taking 1/2 tab twice daily for 1 week then stop; in future plan to add ARNI due to HFpEF and HTN  Continue taking diltiazem 240 mg PO daily, hydralazine 100 mg PO twice daily , spironolactone 25 mg PO daily Patient to keep record of BP readings with heart rate and report to [place] at the next visit Patient to see PharmD in 4 weeks for follow up  Follow up lab(s) : BMP on Dec 18

## 2023-03-19 ENCOUNTER — Other Ambulatory Visit (HOSPITAL_COMMUNITY): Payer: Self-pay

## 2023-03-19 ENCOUNTER — Telehealth: Payer: Self-pay | Admitting: Pharmacy Technician

## 2023-03-19 NOTE — Telephone Encounter (Signed)
Pharmacy Patient Advocate Encounter   Received notification from CoverMyMeds that prior authorization for Jardiance 10MG  tablets is required/requested.   Insurance verification completed.   The patient is insured through [facility] .   Per test claim: PA required; PA submitted to above mentioned insurance via CoverMyMeds Key/confirmation #/EOC [id number] Status is pending

## 2023-03-21 ENCOUNTER — Other Ambulatory Visit (HOSPITAL_COMMUNITY): Payer: Self-pay

## 2023-03-21 NOTE — Telephone Encounter (Signed)
Pharmacy Patient Advocate Encounter  Received notification from [facility] that Prior Authorization for Jardiance 10MG  tablets  has been APPROVED from 03/19/23 to 03/18/24. Ran test claim, Copay is $4.00. This test claim was processed through [facility]- copay amounts may vary at other pharmacies due to pharmacy/plan contracts, or as the patient moves through the different stages of their insurance plan.   PA #/Case ID/Reference #: [id number]

## 2023-04-01 ENCOUNTER — Ambulatory Visit: Payer: Medicaid Other | Attending: Cardiology | Admitting: Pharmacist

## 2023-04-01 VITALS — BP 128/60 | HR 55 | Wt 319.0 lb

## 2023-04-01 DIAGNOSIS — I5032 Chronic diastolic (congestive) heart failure: Secondary | ICD-10-CM | POA: Diagnosis not present

## 2023-04-01 MED ORDER — EMPAGLIFLOZIN 10 MG PO TABS: 10.0000 mg | ORAL_TABLET | Freq: Every day | ORAL | 3 refills | Status: AC
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
## 2023-04-01 NOTE — Assessment & Plan Note (Signed)
 Assessment: Blood pressure is well-controlled at goal of less than 130/80 Patient on clonidine  which we have attempted to taper down previously but patient still taking full dose.  Explained the point of tapering off of clonidine , why it is important to taper, and that we are trying to replace it with a medication that would be more beneficial to his heart. Denies any swelling.  Has not had to use torsemide  lately. Reports compliance with no missed medications Patient wearing CGM and reports better controlled blood sugars PA for Jardiance  has been approved.  New Rx sent to pharmacy Patient given number for IT help for MyChart as he needs to reset his password  Plan: Decrease clonidine  to 0.3mg  (1 tablet) in the AM and 0.15mg  (1/2 tablet) in the PM for 1 week (1/6-1/12) THEN DECREASE to 0.15mg  (1/2 tablet) by mouth twice a day for 1 week (1/13-1/19) I have asked patient to send me blood pressure readings on 1/20.  At that point we will consider adding Entresto and continuing to taper off of clonidine  Continue diltiazem  240 mg daily, hydralazine  100 mg twice a day and spironolactone  25 mg daily Follow-up in clinic on February 12 Follow-up on the phone or via MyChart 1/20 Start Jardiance  10 mg daily BMP at next visit

## 2023-04-01 NOTE — Patient Instructions (Addendum)
 Please decrease clonidine  to 0.3mg  (1 tablet) in the AM and 0.15mg  (1/2 tablet) in the PM for 1 week (1/6-1/12)  THEN DECREASE to 0.15mg  (1/2 tablet) by mouth twice a day for 1 week (1/13-1/19)  Please check your blood pressure at home. Please sent me blood pressure readings on 1/20  Please START Jardiance  10mg  daily  Continue diltiazem  240 mg PO daily, hydralazine  100 mg PO twice daily, spironolactone  25 mg PO daily  Please call me at [phone] with any questions

## 2023-04-04 ENCOUNTER — Telehealth: Payer: Self-pay

## 2023-04-04 NOTE — Telephone Encounter (Signed)
   Pre-operative Risk Assessment    Patient Name: Gary Franco  DOB: [birth year] MRN: [id number]   Date of last office visit: 01/17/23 Date of next office visit: none   Request for Surgical Clearance    Procedure:  Left long and ring finger partial tendon excision and pulley release   Date of Surgery:  Clearance TBD                                Surgeon:  Dr. Prentice Pagan Surgeon's Group or Practice Name:  [facility] Phone number:  [phone] Fax number:  [phone]   Type of Clearance Requested:   - Pharmacy:  Hold Apixaban  (Eliquis )     Type of Anesthesia:  Not Indicated   Additional requests/questions:    Bonney Connye GORMAN Rosalva   04/04/2023, 2:47 PM

## 2023-04-08 ENCOUNTER — Telehealth: Payer: Self-pay | Admitting: *Deleted

## 2023-04-08 ENCOUNTER — Other Ambulatory Visit: Payer: Self-pay | Admitting: Cardiology

## 2023-04-08 DIAGNOSIS — I480 Paroxysmal atrial fibrillation: Secondary | ICD-10-CM

## 2023-04-08 DIAGNOSIS — I10 Essential (primary) hypertension: Secondary | ICD-10-CM

## 2023-04-08 NOTE — Telephone Encounter (Signed)
 Pt has been scheduled tele preop appt 04/15/23. Med rec and consent are done. Surgeon will not schedule until pt has been cleared.

## 2023-04-08 NOTE — Telephone Encounter (Signed)
   Name: Janos Shampine  DOB: February 16, 1966  MRN: [id number]  Primary Cardiologist: Newman JINNY Lawrence, MD  Chart reviewed as part of pre-operative protocol coverage. Because of Laythan Hayter past medical history and time since last visit, he will require a follow-up telephone visit in order to better assess preoperative cardiovascular risk.  Pre-op covering staff: - Please schedule appointment and call patient to inform them. If patient already had an upcoming appointment within acceptable timeframe, please add pre-op clearance to the appointment notes so provider is aware. - Please contact requesting surgeon's office via preferred method (i.e, phone, fax) to inform them of need for appointment prior to surgery.  Per office protocol, patient can hold Eliquis  for 2 days prior to procedure.  He can resume when medically safe to do so.   Orren LOISE Fabry, PA-C  04/08/2023, 8:51 AM

## 2023-04-08 NOTE — Telephone Encounter (Signed)
 Pt has been scheduled tele preop appt 04/15/23. Med rec and consent are done. Surgeon will not schedule until pt has been cleared.      Patient Consent for Virtual Visit        Gary Franco has provided verbal consent on 04/08/2023 for a virtual visit (video or telephone).   CONSENT FOR VIRTUAL VISIT FOR:  Gary Franco  By participating in this virtual visit I agree to the following:  I hereby voluntarily request, consent and authorize [facility] and its employed or contracted physicians, physician assistants, nurse practitioners or other licensed health care professionals (the Practitioner), to provide me with telemedicine health care services (the "Services) as deemed necessary by the treating Practitioner. I acknowledge and consent to receive the Services by the Practitioner via telemedicine. I understand that the telemedicine visit will involve communicating with the Practitioner through live audiovisual communication technology and the disclosure of certain medical information by electronic transmission. I acknowledge that I have been given the opportunity to request an in-person assessment or other available alternative prior to the telemedicine visit and am voluntarily participating in the telemedicine visit.  I understand that I have the right to withhold or withdraw my consent to the use of telemedicine in the course of my care at any time, without affecting my right to future care or treatment, and that the Practitioner or I may terminate the telemedicine visit at any time. I understand that I have the right to inspect all information obtained and/or recorded in the course of the telemedicine visit and may receive copies of available information for a reasonable fee.  I understand that some of the potential risks of receiving the Services via telemedicine include:  Delay or interruption in medical evaluation due to technological equipment failure or disruption; Information  transmitted may not be sufficient (e.g. poor resolution of images) to allow for appropriate medical decision making by the Practitioner; and/or  In rare instances, security protocols could fail, causing a breach of personal health information.  Furthermore, I acknowledge that it is my responsibility to provide information about my medical history, conditions and care that is complete and accurate to the best of my ability. I acknowledge that Practitioner's advice, recommendations, and/or decision may be based on factors not within their control, such as incomplete or inaccurate data provided by me or distortions of diagnostic images or specimens that may result from electronic transmissions. I understand that the practice of medicine is not an exact science and that Practitioner makes no warranties or guarantees regarding treatment outcomes. I acknowledge that a copy of this consent can be made available to me via my patient portal [facility] MyChart), or I can request a printed copy by calling the office of [facility].    I understand that my insurance will be billed for this visit.   I have read or had this consent read to me. I understand the contents of this consent, which adequately explains the benefits and risks of the Services being provided via telemedicine.  I have been provided ample opportunity to ask questions regarding this consent and the Services and have had my questions answered to my satisfaction. I give my informed consent for the services to be provided through the use of telemedicine in my medical care

## 2023-04-08 NOTE — Telephone Encounter (Signed)
 Patient with diagnosis of afib on Eliquis  for anticoagulation.    Procedure: Left long and ring finger partial tendon excision and pulley release    Date of procedure: TBD   CHA2DS2-VASc Score = 4   This indicates a 4.8% annual risk of stroke. The patient's score is based upon: CHF History: 1 HTN History: 1 Diabetes History: 1 Stroke History: 0 Vascular Disease History: 1 Age Score: 0 Gender Score: 0      CrCl 87 ml/min Platelet count 460  Per office protocol, patient can hold Eliquis  for 2 days prior to procedure.    **This guidance is not considered finalized until pre-operative APP has relayed final recommendations.**

## 2023-04-15 ENCOUNTER — Ambulatory Visit: Payer: Medicaid Other | Attending: Cardiovascular Disease | Admitting: Emergency Medicine

## 2023-04-15 DIAGNOSIS — Z01810 Encounter for preprocedural cardiovascular examination: Secondary | ICD-10-CM
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
## 2023-05-08 ENCOUNTER — Ambulatory Visit: Payer: Medicaid Other | Attending: Internal Medicine
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
## 2023-05-27 ENCOUNTER — Other Ambulatory Visit: Payer: Self-pay | Admitting: Cardiology

## 2023-05-27 DIAGNOSIS — I10 Essential (primary) hypertension: Secondary | ICD-10-CM

## 2023-06-28 ENCOUNTER — Ambulatory Visit: Payer: Medicaid Other | Admitting: Pharmacist
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
## 2023-07-05 ENCOUNTER — Other Ambulatory Visit: Payer: Self-pay | Admitting: Cardiology

## 2023-07-05 DIAGNOSIS — I480 Paroxysmal atrial fibrillation: Secondary | ICD-10-CM

## 2023-07-05 DIAGNOSIS — I10 Essential (primary) hypertension: Secondary | ICD-10-CM

## 2023-07-12 ENCOUNTER — Other Ambulatory Visit: Payer: Self-pay | Admitting: Cardiology

## 2023-07-12 DIAGNOSIS — I10 Essential (primary) hypertension: Secondary | ICD-10-CM

## 2023-09-25 ENCOUNTER — Telehealth: Payer: Self-pay | Admitting: *Deleted

## 2023-09-25 NOTE — Telephone Encounter (Signed)
 Pharmacy please advise on holding Eliquis  prior to LEFT LONG AND RING FINGER TENOLYSIS AND JOINT MANIPULATION  scheduled for TBD. Thank you.

## 2023-09-25 NOTE — Telephone Encounter (Signed)
   Pre-operative Risk Assessment    Patient Name: Gary Franco  DOB: [birth year] MRN: [id number]   Date of last office visit: 04/15/23 TELE PREOP VISIT; 01/17/23 DR. PATWARDHAN Date of next office visit: NONE   Request for Surgical Clearance    Procedure:  LEFT LONG AND RING FINGER TENOLYSIS AND JOINT MANIPULATION  Date of Surgery:  Clearance TBD                                Surgeon:  DR. FRED [facility] or Practice Name:  [facility] Phone number:  [phone] Fax number:  [phone] SHERRY WILLS   Type of Clearance Requested:   - Medical  - Pharmacy:  Hold Apixaban  (Eliquis )     Type of Anesthesia:  REGIONAL WITH IV SEDATION   Additional requests/questions:    Bonney Niels Jest   09/25/2023, 10:38 AM

## 2023-09-26 ENCOUNTER — Telehealth: Payer: Self-pay

## 2023-09-26 NOTE — Telephone Encounter (Signed)
 Patient with diagnosis of afib on Eliquis  for anticoagulation.    Procedure: LEFT LONG AND RING FINGER TENOLYSIS AND JOINT MANIPULATION  Date of procedure: TBD   CHA2DS2-VASc Score = 4   This indicates a 4.8% annual risk of stroke. The patient's score is based upon: CHF History: 1 HTN History: 1 Diabetes History: 1 Stroke History: 0 Vascular Disease History: 1 Age Score: 0 Gender Score: 0      CrCl 86 ml/min Platelet count 460  Patient has not had an Afib/aflutter ablation within the last 3 months or DCCV within the last 30 days  Per office protocol, patient can hold Eliquis  for 2 days prior to procedure.    **This guidance is not considered finalized until pre-operative APP has relayed final recommendations.**

## 2023-09-26 NOTE — Telephone Encounter (Signed)
 S/W pt and scheduled TELE Preop appt 10/04/23. Med rec and consent done.

## 2023-09-26 NOTE — Telephone Encounter (Signed)
 Med Rec and Consent done     Patient Consent for Virtual Visit        Gary Franco has provided verbal consent on 09/26/2023 for a virtual visit (video or telephone).   CONSENT FOR VIRTUAL VISIT FOR:  Gary Franco  By participating in this virtual visit I agree to the following:  I hereby voluntarily request, consent and authorize [facility] and its employed or contracted physicians, physician assistants, nurse practitioners or other licensed health care professionals (the Practitioner), to provide me with telemedicine health care services (the "Services) as deemed necessary by the treating Practitioner. I acknowledge and consent to receive the Services by the Practitioner via telemedicine. I understand that the telemedicine visit will involve communicating with the Practitioner through live audiovisual communication technology and the disclosure of certain medical information by electronic transmission. I acknowledge that I have been given the opportunity to request an in-person assessment or other available alternative prior to the telemedicine visit and am voluntarily participating in the telemedicine visit.  I understand that I have the right to withhold or withdraw my consent to the use of telemedicine in the course of my care at any time, without affecting my right to future care or treatment, and that the Practitioner or I may terminate the telemedicine visit at any time. I understand that I have the right to inspect all information obtained and/or recorded in the course of the telemedicine visit and may receive copies of available information for a reasonable fee.  I understand that some of the potential risks of receiving the Services via telemedicine include:  Delay or interruption in medical evaluation due to technological equipment failure or disruption; Information transmitted may not be sufficient (e.g. poor resolution of images) to allow for appropriate medical decision making  by the Practitioner; and/or  In rare instances, security protocols could fail, causing a breach of personal health information.  Furthermore, I acknowledge that it is my responsibility to provide information about my medical history, conditions and care that is complete and accurate to the best of my ability. I acknowledge that Practitioner's advice, recommendations, and/or decision may be based on factors not within their control, such as incomplete or inaccurate data provided by me or distortions of diagnostic images or specimens that may result from electronic transmissions. I understand that the practice of medicine is not an exact science and that Practitioner makes no warranties or guarantees regarding treatment outcomes. I acknowledge that a copy of this consent can be made available to me via my patient portal [facility] MyChart), or I can request a printed copy by calling the office of [facility].    I understand that my insurance will be billed for this visit.   I have read or had this consent read to me. I understand the contents of this consent, which adequately explains the benefits and risks of the Services being provided via telemedicine.  I have been provided ample opportunity to ask questions regarding this consent and the Services and have had my questions answered to my satisfaction. I give my informed consent for the services to be provided through the use of telemedicine in my medical care

## 2023-09-26 NOTE — Telephone Encounter (Signed)
   Name: Gary Franco  DOB: 17-May-1965  MRN: [id number]  Primary Cardiologist: Newman JINNY Lawrence, MD   Preoperative team, please contact this patient and set up a phone call appointment for further preoperative risk assessment. Please obtain consent and complete medication review. Thank you for your help.  I confirm that guidance regarding antiplatelet and oral anticoagulation therapy has been completed and, if necessary, noted below.  Patient has not had an Afib/aflutter ablation within the last 3 months or DCCV within the last 30 days   Per office protocol, patient can hold Eliquis  for 2 days prior to procedure.    I also confirmed the patient resides in the state of [place] . As per [facility] Medical Board telemedicine laws, the patient must reside in the state in which the provider is licensed.   Lum LITTIE Louis, NP 09/26/2023, 8:49 AM [facility]
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
## 2023-10-04 ENCOUNTER — Ambulatory Visit: Attending: Internal Medicine

## 2023-10-04 DIAGNOSIS — Z01810 Encounter for preprocedural cardiovascular examination: Secondary | ICD-10-CM | POA: Diagnosis not present
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
## 2023-10-18 ENCOUNTER — Other Ambulatory Visit: Payer: Self-pay | Admitting: Cardiology

## 2023-10-18 DIAGNOSIS — I10 Essential (primary) hypertension: Secondary | ICD-10-CM

## 2023-10-30 ENCOUNTER — Other Ambulatory Visit: Payer: Self-pay

## 2023-10-30 ENCOUNTER — Encounter (HOSPITAL_BASED_OUTPATIENT_CLINIC_OR_DEPARTMENT_OTHER): Payer: Self-pay

## 2023-10-30 ENCOUNTER — Emergency Department (HOSPITAL_BASED_OUTPATIENT_CLINIC_OR_DEPARTMENT_OTHER): Admitting: Radiology

## 2023-10-30 ENCOUNTER — Emergency Department (HOSPITAL_BASED_OUTPATIENT_CLINIC_OR_DEPARTMENT_OTHER)
Admission: EM | Admit: 2023-10-30 | Discharge: 2023-10-30 | Disposition: A | Discharge status: Home / Self Care | Attending: Emergency Medicine | Admitting: Emergency Medicine

## 2023-10-30 DIAGNOSIS — Z794 Long term (current) use of insulin: Secondary | ICD-10-CM | POA: Insufficient documentation

## 2023-10-30 DIAGNOSIS — I509 Heart failure, unspecified: Secondary | ICD-10-CM | POA: Insufficient documentation

## 2023-10-30 DIAGNOSIS — E119 Type 2 diabetes mellitus without complications: Secondary | ICD-10-CM | POA: Insufficient documentation

## 2023-10-30 DIAGNOSIS — Z7901 Long term (current) use of anticoagulants: Secondary | ICD-10-CM | POA: Insufficient documentation

## 2023-10-30 DIAGNOSIS — S4992XA Unspecified injury of left shoulder and upper arm, initial encounter: Secondary | ICD-10-CM | POA: Diagnosis present

## 2023-10-30 DIAGNOSIS — S43421A Sprain of right rotator cuff capsule, initial encounter: Secondary | ICD-10-CM | POA: Insufficient documentation

## 2023-10-30 DIAGNOSIS — W06XXXA Fall from bed, initial encounter: Secondary | ICD-10-CM | POA: Insufficient documentation

## 2023-10-30 DIAGNOSIS — I110 Hypertensive heart disease with heart failure: Secondary | ICD-10-CM | POA: Insufficient documentation

## 2023-10-30 DIAGNOSIS — M25511 Pain in right shoulder: Secondary | ICD-10-CM

## 2023-10-30 MED ORDER — LIDOCAINE 5 % EX PTCH
1.0000 | MEDICATED_PATCH | CUTANEOUS | Status: DC
  Administered 2023-10-30: 1 via TRANSDERMAL
  Filled 2023-10-30: qty 1

## 2023-10-30 MED ORDER — LIDOCAINE 5 % EX PTCH: 1.0000 | MEDICATED_PATCH | CUTANEOUS | 0 refills | Status: AC

## 2023-10-30 MED ORDER — OXYCODONE HCL 5 MG PO TABS
5.0000 mg | ORAL_TABLET | Freq: Once | ORAL | Status: AC
  Administered 2023-10-30: 5 mg via ORAL
  Filled 2023-10-30: qty 1

## 2023-10-30 MED ORDER — CYCLOBENZAPRINE HCL 10 MG PO TABS: 10.0000 mg | ORAL_TABLET | Freq: Two times a day (BID) | ORAL | 0 refills | Status: AC | PRN

## 2023-10-30 MED ORDER — DIAZEPAM 5 MG/ML IJ SOLN
2.5000 mg | Freq: Once | INTRAMUSCULAR | Status: AC
  Administered 2023-10-30: 2.5 mg via INTRAMUSCULAR
  Filled 2023-10-30: qty 2
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
## 2023-10-30 NOTE — ED Notes (Signed)
 Patient transported to X-ray

## 2023-10-30 NOTE — ED Triage Notes (Signed)
 Patient reports falling out of bed 2 days ago. Today he reports his right shoulder is in a lot of pain and he is unable to move it much. He reports his 20mg  of oxycodone  has not helped.

## 2023-11-12 ENCOUNTER — Other Ambulatory Visit: Payer: Self-pay | Admitting: Cardiology

## 2023-11-12 DIAGNOSIS — I10 Essential (primary) hypertension: Secondary | ICD-10-CM

## 2023-11-29 ENCOUNTER — Ambulatory Visit: Admitting: Podiatry

## 2023-12-04 ENCOUNTER — Ambulatory Visit: Admitting: Podiatry

## 2023-12-04 ENCOUNTER — Encounter: Payer: Self-pay | Admitting: Podiatry

## 2023-12-04 VITALS — Ht 72.0 in | Wt 301.0 lb

## 2023-12-04 DIAGNOSIS — M79675 Pain in left toe(s): Secondary | ICD-10-CM

## 2023-12-04 DIAGNOSIS — B351 Tinea unguium: Secondary | ICD-10-CM

## 2023-12-04 DIAGNOSIS — M79674 Pain in right toe(s): Secondary | ICD-10-CM | POA: Diagnosis not present

## 2023-12-04 DIAGNOSIS — E1140 Type 2 diabetes mellitus with diabetic neuropathy, unspecified: Secondary | ICD-10-CM | POA: Diagnosis not present

## 2023-12-04 MED ORDER — CICLOPIROX 8 % EX SOLN: Freq: Every day | CUTANEOUS | 12 refills | Status: AC
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
## 2023-12-04 NOTE — Patient Instructions (Signed)
 Look for urea 40-47% nail gel cream or ointment and apply to the thickened toenails. This can be bought over the counter, at a pharmacy or online such as [employer].
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
## 2023-12-10 ENCOUNTER — Encounter (INDEPENDENT_AMBULATORY_CARE_PROVIDER_SITE_OTHER): Admitting: Ophthalmology

## 2023-12-10 ENCOUNTER — Ambulatory Visit (INDEPENDENT_AMBULATORY_CARE_PROVIDER_SITE_OTHER): Admitting: Ophthalmology

## 2023-12-10 DIAGNOSIS — E113213 Type 2 diabetes mellitus with mild nonproliferative diabetic retinopathy with macular edema, bilateral: Secondary | ICD-10-CM

## 2023-12-10 DIAGNOSIS — I10 Essential (primary) hypertension: Secondary | ICD-10-CM | POA: Diagnosis not present

## 2023-12-10 DIAGNOSIS — Z794 Long term (current) use of insulin: Secondary | ICD-10-CM | POA: Diagnosis not present

## 2023-12-10 DIAGNOSIS — Z7984 Long term (current) use of oral hypoglycemic drugs: Secondary | ICD-10-CM

## 2023-12-10 DIAGNOSIS — E119 Type 2 diabetes mellitus without complications: Secondary | ICD-10-CM

## 2023-12-10 DIAGNOSIS — H35033 Hypertensive retinopathy, bilateral: Secondary | ICD-10-CM

## 2023-12-10 DIAGNOSIS — H25813 Combined forms of age-related cataract, bilateral: Secondary | ICD-10-CM | POA: Diagnosis not present

## 2023-12-13 ENCOUNTER — Ambulatory Visit: Payer: Self-pay | Admitting: Podiatry

## 2023-12-13 DIAGNOSIS — B351 Tinea unguium: Secondary | ICD-10-CM

## 2023-12-17 ENCOUNTER — Encounter (INDEPENDENT_AMBULATORY_CARE_PROVIDER_SITE_OTHER): Payer: Self-pay | Admitting: Ophthalmology

## 2024-03-05 ENCOUNTER — Ambulatory Visit: Admitting: Podiatry

## 2024-03-19 ENCOUNTER — Other Ambulatory Visit: Payer: Self-pay | Admitting: Cardiology

## 2024-03-19 DIAGNOSIS — I10 Essential (primary) hypertension: Secondary | ICD-10-CM

## 2024-04-15 ENCOUNTER — Other Ambulatory Visit: Payer: Self-pay | Admitting: Cardiology

## 2024-04-15 DIAGNOSIS — I480 Paroxysmal atrial fibrillation: Secondary | ICD-10-CM

## 2024-04-15 DIAGNOSIS — I10 Essential (primary) hypertension: Secondary | ICD-10-CM

## 2024-04-21 MED ORDER — DILTIAZEM HCL ER COATED BEADS 240 MG PO CP24: 240.0000 mg | ORAL_CAPSULE | Freq: Every day | ORAL | 3 refills | Status: AC

## 2024-05-08 ENCOUNTER — Ambulatory Visit

## 2024-12-09 ENCOUNTER — Encounter (INDEPENDENT_AMBULATORY_CARE_PROVIDER_SITE_OTHER): Admitting: Ophthalmology
